# Patient Record
Sex: Female | Born: 1940 | Race: White | Hispanic: No | Marital: Married | Smoking: Former smoker
Health system: Southern US, Community
[De-identification: ages and names within clinical notes are randomized; demographics above are authoritative.]

## PROBLEM LIST (undated history)

## (undated) DIAGNOSIS — K219 Gastro-esophageal reflux disease without esophagitis: Secondary | ICD-10-CM

## (undated) DIAGNOSIS — N870 Mild cervical dysplasia: Secondary | ICD-10-CM

## (undated) DIAGNOSIS — I639 Cerebral infarction, unspecified: Secondary | ICD-10-CM

## (undated) DIAGNOSIS — F329 Major depressive disorder, single episode, unspecified: Secondary | ICD-10-CM

## (undated) DIAGNOSIS — Q211 Atrial septal defect: Secondary | ICD-10-CM

## (undated) DIAGNOSIS — Q2112 Patent foramen ovale: Secondary | ICD-10-CM

## (undated) DIAGNOSIS — D496 Neoplasm of unspecified behavior of brain: Secondary | ICD-10-CM

## (undated) DIAGNOSIS — F32A Depression, unspecified: Secondary | ICD-10-CM

## (undated) DIAGNOSIS — K5792 Diverticulitis of intestine, part unspecified, without perforation or abscess without bleeding: Secondary | ICD-10-CM

## (undated) DIAGNOSIS — N200 Calculus of kidney: Secondary | ICD-10-CM

## (undated) DIAGNOSIS — Z5189 Encounter for other specified aftercare: Secondary | ICD-10-CM

## (undated) DIAGNOSIS — M1990 Unspecified osteoarthritis, unspecified site: Secondary | ICD-10-CM

## (undated) DIAGNOSIS — M797 Fibromyalgia: Secondary | ICD-10-CM

## (undated) DIAGNOSIS — Z8774 Personal history of (corrected) congenital malformations of heart and circulatory system: Secondary | ICD-10-CM

## (undated) DIAGNOSIS — IMO0001 Reserved for inherently not codable concepts without codable children: Secondary | ICD-10-CM

## (undated) HISTORY — DX: Diverticulitis of intestine, part unspecified, without perforation or abscess without bleeding: K57.92

## (undated) HISTORY — DX: Calculus of kidney: N20.0

## (undated) HISTORY — PX: COLPOSCOPY: SHX161

## (undated) HISTORY — PX: BRAIN BIOPSY: SHX905

## (undated) HISTORY — PX: STOMACH SURGERY: SHX791

## (undated) HISTORY — DX: Cerebral infarction, unspecified: I63.9

## (undated) HISTORY — PX: KIDNEY STONE SURGERY: SHX686

## (undated) HISTORY — PX: BREAST ENHANCEMENT SURGERY: SHX7

## (undated) HISTORY — DX: Major depressive disorder, single episode, unspecified: F32.9

## (undated) HISTORY — PX: TUBAL LIGATION: SHX77

## (undated) HISTORY — DX: Personal history of (corrected) congenital malformations of heart and circulatory system: Z87.74

## (undated) HISTORY — PX: AUGMENTATION MAMMAPLASTY: SUR837

## (undated) HISTORY — DX: Mild cervical dysplasia: N87.0

## (undated) HISTORY — DX: Depression, unspecified: F32.A

---

## 1997-11-18 ENCOUNTER — Other Ambulatory Visit: Admission: RE | Admit: 1997-11-18 | Discharge: 1997-11-18 | Payer: Self-pay | Admitting: Obstetrics and Gynecology

## 1997-12-29 ENCOUNTER — Other Ambulatory Visit: Admission: RE | Admit: 1997-12-29 | Discharge: 1997-12-29 | Payer: Self-pay | Admitting: Obstetrics and Gynecology

## 1998-04-26 ENCOUNTER — Other Ambulatory Visit: Admission: RE | Admit: 1998-04-26 | Discharge: 1998-04-26 | Payer: Self-pay | Admitting: Obstetrics and Gynecology

## 1999-04-11 ENCOUNTER — Other Ambulatory Visit: Admission: RE | Admit: 1999-04-11 | Discharge: 1999-04-11 | Payer: Self-pay | Admitting: Obstetrics and Gynecology

## 1999-08-31 ENCOUNTER — Other Ambulatory Visit: Admission: RE | Admit: 1999-08-31 | Discharge: 1999-08-31 | Payer: Self-pay | Admitting: Obstetrics and Gynecology

## 1999-12-23 ENCOUNTER — Emergency Department (HOSPITAL_COMMUNITY): Admission: EM | Admit: 1999-12-23 | Discharge: 1999-12-24 | Payer: Self-pay | Admitting: Emergency Medicine

## 1999-12-24 ENCOUNTER — Encounter: Payer: Self-pay | Admitting: Emergency Medicine

## 1999-12-28 ENCOUNTER — Ambulatory Visit (HOSPITAL_COMMUNITY): Admission: RE | Admit: 1999-12-28 | Discharge: 1999-12-28 | Payer: Self-pay | Admitting: Urology

## 1999-12-28 ENCOUNTER — Encounter: Payer: Self-pay | Admitting: Urology

## 2000-04-10 ENCOUNTER — Other Ambulatory Visit: Admission: RE | Admit: 2000-04-10 | Discharge: 2000-04-10 | Payer: Self-pay | Admitting: Obstetrics and Gynecology

## 2001-02-12 ENCOUNTER — Ambulatory Visit (HOSPITAL_COMMUNITY): Admission: RE | Admit: 2001-02-12 | Discharge: 2001-02-12 | Payer: Self-pay | Admitting: Neurology

## 2001-02-12 ENCOUNTER — Encounter (INDEPENDENT_AMBULATORY_CARE_PROVIDER_SITE_OTHER): Payer: Self-pay | Admitting: *Deleted

## 2001-02-14 ENCOUNTER — Encounter: Payer: Self-pay | Admitting: Neurology

## 2001-02-14 ENCOUNTER — Ambulatory Visit (HOSPITAL_COMMUNITY): Admission: RE | Admit: 2001-02-14 | Discharge: 2001-02-14 | Payer: Self-pay | Admitting: Neurology

## 2001-03-04 ENCOUNTER — Encounter: Payer: Self-pay | Admitting: Neurology

## 2001-03-04 ENCOUNTER — Ambulatory Visit (HOSPITAL_COMMUNITY): Admission: RE | Admit: 2001-03-04 | Discharge: 2001-03-04 | Payer: Self-pay | Admitting: Neurology

## 2001-04-09 ENCOUNTER — Encounter: Payer: Self-pay | Admitting: Neurosurgery

## 2001-04-09 ENCOUNTER — Ambulatory Visit (HOSPITAL_COMMUNITY): Admission: RE | Admit: 2001-04-09 | Discharge: 2001-04-09 | Payer: Self-pay | Admitting: Neurosurgery

## 2001-04-14 ENCOUNTER — Encounter: Payer: Self-pay | Admitting: Neurosurgery

## 2001-04-16 ENCOUNTER — Encounter (INDEPENDENT_AMBULATORY_CARE_PROVIDER_SITE_OTHER): Payer: Self-pay | Admitting: Specialist

## 2001-04-16 ENCOUNTER — Inpatient Hospital Stay (HOSPITAL_COMMUNITY): Admission: RE | Admit: 2001-04-16 | Discharge: 2001-04-19 | Payer: Self-pay | Admitting: Neurosurgery

## 2001-04-16 ENCOUNTER — Encounter: Payer: Self-pay | Admitting: Neurosurgery

## 2001-04-17 ENCOUNTER — Encounter: Payer: Self-pay | Admitting: Neurosurgery

## 2001-06-17 ENCOUNTER — Other Ambulatory Visit: Admission: RE | Admit: 2001-06-17 | Discharge: 2001-06-17 | Payer: Self-pay | Admitting: Obstetrics and Gynecology

## 2002-03-24 ENCOUNTER — Encounter: Payer: Self-pay | Admitting: Internal Medicine

## 2002-03-24 ENCOUNTER — Inpatient Hospital Stay (HOSPITAL_COMMUNITY): Admission: EM | Admit: 2002-03-24 | Discharge: 2002-03-26 | Payer: Self-pay | Admitting: Emergency Medicine

## 2002-10-04 ENCOUNTER — Encounter: Payer: Self-pay | Admitting: Emergency Medicine

## 2002-10-04 ENCOUNTER — Inpatient Hospital Stay (HOSPITAL_COMMUNITY): Admission: EM | Admit: 2002-10-04 | Discharge: 2002-10-06 | Payer: Self-pay | Admitting: Emergency Medicine

## 2002-10-05 ENCOUNTER — Encounter (INDEPENDENT_AMBULATORY_CARE_PROVIDER_SITE_OTHER): Payer: Self-pay | Admitting: *Deleted

## 2002-10-05 ENCOUNTER — Encounter: Payer: Self-pay | Admitting: Pediatrics

## 2002-10-29 ENCOUNTER — Encounter: Payer: Self-pay | Admitting: Emergency Medicine

## 2002-10-29 ENCOUNTER — Emergency Department (HOSPITAL_COMMUNITY): Admission: EM | Admit: 2002-10-29 | Discharge: 2002-10-29 | Payer: Self-pay | Admitting: Emergency Medicine

## 2003-02-09 ENCOUNTER — Ambulatory Visit (HOSPITAL_COMMUNITY): Admission: RE | Admit: 2003-02-09 | Discharge: 2003-02-09 | Payer: Self-pay | Admitting: Neurology

## 2003-12-14 ENCOUNTER — Ambulatory Visit (HOSPITAL_COMMUNITY): Admission: RE | Admit: 2003-12-14 | Discharge: 2003-12-14 | Payer: Self-pay | Admitting: Neurology

## 2004-10-31 ENCOUNTER — Other Ambulatory Visit: Admission: RE | Admit: 2004-10-31 | Discharge: 2004-10-31 | Payer: Self-pay | Admitting: Obstetrics and Gynecology

## 2006-11-14 ENCOUNTER — Encounter: Admission: RE | Admit: 2006-11-14 | Discharge: 2006-11-14 | Payer: Self-pay | Admitting: Family Medicine

## 2006-12-09 ENCOUNTER — Other Ambulatory Visit: Admission: RE | Admit: 2006-12-09 | Discharge: 2006-12-09 | Payer: Self-pay | Admitting: Obstetrics and Gynecology

## 2007-02-10 ENCOUNTER — Encounter: Admission: RE | Admit: 2007-02-10 | Discharge: 2007-02-10 | Payer: Self-pay | Admitting: Gastroenterology

## 2008-04-15 ENCOUNTER — Observation Stay (HOSPITAL_COMMUNITY): Admission: EM | Admit: 2008-04-15 | Discharge: 2008-04-16 | Payer: Self-pay | Admitting: Emergency Medicine

## 2008-06-02 ENCOUNTER — Inpatient Hospital Stay (HOSPITAL_COMMUNITY): Admission: EM | Admit: 2008-06-02 | Discharge: 2008-06-05 | Payer: Self-pay | Admitting: Emergency Medicine

## 2008-06-03 ENCOUNTER — Ambulatory Visit: Payer: Self-pay | Admitting: Vascular Surgery

## 2008-06-03 ENCOUNTER — Encounter (INDEPENDENT_AMBULATORY_CARE_PROVIDER_SITE_OTHER): Payer: Self-pay | Admitting: Internal Medicine

## 2008-06-28 ENCOUNTER — Encounter: Admission: RE | Admit: 2008-06-28 | Discharge: 2008-07-27 | Payer: Self-pay | Admitting: Psychology

## 2008-07-28 ENCOUNTER — Ambulatory Visit: Payer: Self-pay | Admitting: Psychology

## 2008-12-09 ENCOUNTER — Ambulatory Visit: Payer: Self-pay | Admitting: Obstetrics and Gynecology

## 2008-12-09 ENCOUNTER — Encounter: Payer: Self-pay | Admitting: Obstetrics and Gynecology

## 2008-12-09 ENCOUNTER — Other Ambulatory Visit: Admission: RE | Admit: 2008-12-09 | Discharge: 2008-12-09 | Payer: Self-pay | Admitting: Obstetrics and Gynecology

## 2008-12-22 ENCOUNTER — Ambulatory Visit: Payer: Self-pay | Admitting: Obstetrics and Gynecology

## 2009-03-07 ENCOUNTER — Encounter (INDEPENDENT_AMBULATORY_CARE_PROVIDER_SITE_OTHER): Payer: Self-pay | Admitting: Emergency Medicine

## 2009-03-07 ENCOUNTER — Ambulatory Visit: Payer: Self-pay | Admitting: Vascular Surgery

## 2009-03-07 ENCOUNTER — Emergency Department (HOSPITAL_COMMUNITY): Admission: EM | Admit: 2009-03-07 | Discharge: 2009-03-07 | Payer: Self-pay | Admitting: Emergency Medicine

## 2009-12-12 ENCOUNTER — Ambulatory Visit: Payer: Self-pay | Admitting: Obstetrics and Gynecology

## 2009-12-12 ENCOUNTER — Other Ambulatory Visit: Admission: RE | Admit: 2009-12-12 | Discharge: 2009-12-12 | Payer: Self-pay | Admitting: Obstetrics and Gynecology

## 2010-07-06 LAB — CBC
HCT: 35 % — ABNORMAL LOW (ref 36.0–46.0)
HCT: 35.3 % — ABNORMAL LOW (ref 36.0–46.0)
Hemoglobin: 11.8 g/dL — ABNORMAL LOW (ref 12.0–15.0)
Hemoglobin: 11.9 g/dL — ABNORMAL LOW (ref 12.0–15.0)
MCHC: 33.7 g/dL (ref 30.0–36.0)
MCV: 94.4 fL (ref 78.0–100.0)
RBC: 3.7 MIL/uL — ABNORMAL LOW (ref 3.87–5.11)
RBC: 3.74 MIL/uL — ABNORMAL LOW (ref 3.87–5.11)
RDW: 13.9 % (ref 11.5–15.5)
RDW: 14.1 % (ref 11.5–15.5)
WBC: 6.6 10*3/uL (ref 4.0–10.5)

## 2010-07-06 LAB — COMPREHENSIVE METABOLIC PANEL
ALT: 15 U/L (ref 0–35)
Alkaline Phosphatase: 45 U/L (ref 39–117)
Alkaline Phosphatase: 51 U/L (ref 39–117)
BUN: 15 mg/dL (ref 6–23)
BUN: 23 mg/dL (ref 6–23)
CO2: 31 mEq/L (ref 19–32)
CO2: 32 mEq/L (ref 19–32)
Creatinine, Ser: 1.08 mg/dL (ref 0.4–1.2)
GFR calc non Af Amer: 50 mL/min — ABNORMAL LOW (ref >60)
GFR calc non Af Amer: >60 mL/min (ref >60)
Glucose, Bld: 84 mg/dL (ref 70–99)
Glucose, Bld: 99 mg/dL (ref 70–99)
Potassium: 3.5 mEq/L (ref 3.5–5.1)
Potassium: 3.7 mEq/L (ref 3.5–5.1)
Sodium: 142 mEq/L (ref 135–145)
Total Protein: 6 g/dL (ref 6.0–8.3)

## 2010-07-06 LAB — CK TOTAL AND CKMB (NOT AT ARMC)
CK, MB: 1.4 ng/mL (ref 0.3–4.0)
Total CK: 258 U/L — ABNORMAL HIGH (ref 7–177)

## 2010-07-06 LAB — DIFFERENTIAL
Eosinophils Absolute: 0.2 10*3/uL (ref 0.0–0.7)
Eosinophils Relative: 4 % (ref 0–5)
Monocytes Relative: 7 % (ref 3–12)

## 2010-07-06 LAB — IRON AND TIBC
Iron: 49 ug/dL (ref 42–135)
TIBC: 259 ug/dL (ref 250–470)

## 2010-07-06 LAB — TSH
TSH: 3.317 u[IU]/mL (ref 0.350–4.500)
TSH: 3.459 u[IU]/mL (ref 0.350–4.500)

## 2010-07-06 LAB — FERRITIN: Ferritin: 39 ng/mL (ref 10–291)

## 2010-07-06 LAB — RETICULOCYTES
RBC.: 3.79 MIL/uL — ABNORMAL LOW (ref 3.87–5.11)
Retic Count, Absolute: 22.7 10*3/uL (ref 19.0–186.0)
Retic Ct Pct: 0.6 % (ref 0.4–3.1)

## 2010-07-06 LAB — URINALYSIS, ROUTINE W REFLEX MICROSCOPIC
Glucose, UA: NEGATIVE mg/dL
Hgb urine dipstick: NEGATIVE
Nitrite: NEGATIVE
Specific Gravity, Urine: 1.03 (ref 1.005–1.030)
Urobilinogen, UA: 1 mg/dL (ref 0.0–1.0)

## 2010-07-06 LAB — RPR: RPR Ser Ql: NONREACTIVE

## 2010-07-06 LAB — LIPID PANEL
Cholesterol: 133 mg/dL (ref 0–200)
HDL: 43 mg/dL (ref >39)
LDL Cholesterol: 78 mg/dL (ref 0–99)
Triglycerides: 61 mg/dL (ref <150)

## 2010-07-06 LAB — D-DIMER, QUANTITATIVE: D-Dimer, Quant: 2.2 ug/mL-FEU — ABNORMAL HIGH (ref 0.00–0.48)

## 2010-07-06 LAB — CARDIAC PANEL(CRET KIN+CKTOT+MB+TROPI)
CK, MB: 1.4 ng/mL (ref 0.3–4.0)
Relative Index: 0.6 (ref 0.0–2.5)

## 2010-07-06 LAB — POCT CARDIAC MARKERS: Myoglobin, poc: 119 ng/mL (ref 12–200)

## 2010-07-06 LAB — HOMOCYSTEINE: Homocysteine: 8.9 umol/L (ref 4.0–15.4)

## 2010-07-06 LAB — URINE MICROSCOPIC-ADD ON

## 2010-07-06 LAB — BRAIN NATRIURETIC PEPTIDE: Pro B Natriuretic peptide (BNP): 105 pg/mL — ABNORMAL HIGH (ref 0.0–100.0)

## 2010-07-06 LAB — HEMOGLOBIN A1C: Mean Plasma Glucose: 105 mg/dL

## 2010-07-06 LAB — PROTIME-INR: Prothrombin Time: 14 seconds (ref 11.6–15.2)

## 2010-07-06 LAB — VITAMIN B12: Vitamin B-12: 725 pg/mL (ref 211–911)

## 2010-07-10 LAB — LIPID PANEL
Cholesterol: 135 mg/dL (ref 0–200)
LDL Cholesterol: 84 mg/dL (ref 0–99)

## 2010-07-10 LAB — COMPREHENSIVE METABOLIC PANEL
ALT: 17 U/L (ref 0–35)
AST: 31 U/L (ref 0–37)
CO2: 27 mEq/L (ref 19–32)
Calcium: 9.4 mg/dL (ref 8.4–10.5)
Chloride: 101 mEq/L (ref 96–112)
Creatinine, Ser: 0.97 mg/dL (ref 0.4–1.2)
GFR calc Af Amer: >60 mL/min (ref >60)
GFR calc non Af Amer: 57 mL/min — ABNORMAL LOW (ref >60)
Glucose, Bld: 95 mg/dL (ref 70–99)
Total Bilirubin: 0.7 mg/dL (ref 0.3–1.2)

## 2010-07-10 LAB — CBC
Hemoglobin: 12.4 g/dL (ref 12.0–15.0)
MCHC: 32.8 g/dL (ref 30.0–36.0)
MCV: 93.7 fL (ref 78.0–100.0)
RBC: 4.05 MIL/uL (ref 3.87–5.11)
WBC: 7.6 10*3/uL (ref 4.0–10.5)

## 2010-07-10 LAB — DIFFERENTIAL
Basophils Absolute: 0 10*3/uL (ref 0.0–0.1)
Eosinophils Absolute: 0 10*3/uL (ref 0.0–0.7)
Eosinophils Relative: 1 % (ref 0–5)
Lymphocytes Relative: 28 % (ref 12–46)
Neutrophils Relative %: 62 % (ref 43–77)

## 2010-08-08 NOTE — H&P (Signed)
[name], BRASHEAR                ACCOUNT NO.:  [id number]   MEDICAL RECORD NO.:  [id number]          PATIENT TYPE:  EMS   LOCATION:  [facility]                         FACILITY:  [facility]   PHYSICIAN:  Hollice Espy, M.D.DATE OF BIRTH:  1940/10/15   DATE OF ADMISSION:  04/15/2008  DATE OF DISCHARGE:                              HISTORY & PHYSICAL   The patient's PCP is Dr. Marny Lowenstein.   CHIEF COMPLAINT:  Blurry vision and headache.   HISTORY OF PRESENT ILLNESS:  The patient is a 70 year old white female  with past medical history of previous CVA with no residual deficits,  hypertension who for the last several months family has been noting some  increased memory issues.  However, starting yesterday she started having  some severe blurry vision and headache, worst at about 11:00 last night.  However, symptoms persisted into today. Family became worried, and they  brought the patient into the emergency room for further evaluation.  In  the emergency room she had labs done which were essentially  unremarkable.  A CT scan of the head showed some encephalomalacia and  old infarct but no evidence of any acute CVA.  The patient also was  complaining of some difficulty swallowing, although she seemed to have  passed an ER swallow evaluation without problem.  She in addition also  complained of not eating very much. She says she had an appetite, but  after a few bites she just does not feel like eating any more. She  otherwise is doing well.  She says the headache is better now.  She does  complain of some mild blurry vision but not as severe.  No dysphagia  currently. No chest pain, palpitations, shortness breath, wheeze, cough,  abdominal pain, hematuria, dysuria, constipation, diarrhea, focal  extremity numbness weakness or pain.   REVIEW OF SYSTEMS:  Otherwise negative.   PAST MEDICAL HISTORY:  Includes CVA, hypertension, anxiety, sleep  disturbance and depression.   MEDICATIONS:   She is on HCTZ 25, Ambien 10 q.h.s., Wellbutrin 300,  Klonopin 1, Protonix 40. She also has a history of GERD.   ALLERGIES:  She has no known drug allergies.   SOCIAL HISTORY:  She denies any tobacco, alcohol or drug use.   FAMILY HISTORY:  Noncontributory.   FAMILY HISTORY:  Noted for diabetes mellitus.   PHYSICAL EXAMINATION:  VITALS:  On admission temperature 97.2, heart  rate 83, blood pressure 137/76, respirations 16, O2 sat 98% on room air.  On her own blood pressure came down to 114/73.  GENERAL:  She is alert and oriented x3, in no apparent distress.  HEENT:  Normocephalic, atraumatic.  Mucous membranes are slightly dry.  She has no carotid bruits.  Cranial nerves II through XII are intact.  HEART:  Regular rate and rhythm, S1-S2, a 2/6 systolic ejection murmur.  LUNGS: Clear to auscultation bilaterally.  ABDOMEN:  Soft, nontender, nondistended.  Positive bowel sounds.  EXTREMITIES: No clubbing, cyanosis or edema.  NEUROLOGICAL:  She has no focal neurological deficits.  No loss of  sensation. Grip extension and flexion are all  5/5 symmetric on both  sides.  Finger-to-nose is normal.  Downgoing toes.   LAB WORK:  White count 7.6, H and H 12.4 and 37.9, MCV of 93.7, platelet  count 188 with 16% neutrophils.  Sodium 130, potassium 3.2, chloride  101, bicarb 27, BUN 19, creatinine 0.97, glucose 95.  LFTs were  completely unremarkable including albumin 3.6.  CT scan of the head  showed no evidence of any acute intracranial abnormality, right frontal  encephalomalacia, remote right cerebellar infarct.   ASSESSMENT AND PLAN:  1. Blurry vision, headache.  2. History of cerebrovascular accident.  3. Hypertension.  This possibly could be a transient ischemic attack,      or this could be an old cerebrovascular accident with worsening      encephalomalacia that leads to deterioration.  She also could have      some early signs of Alzheimer's disease.  Will plan to check an       MRI/ MRA as well as continue checking the screening labs, fasting      lipid profile and homocystine.  Will continue all of her home meds.      If she passes all this, then would recommend outpatient monitoring      of the patient. And I have already discussed with family. They tell      me that they already have plans for the patient, who lives alone      right now, to move in with a son-in-law.  4. Hypertension.  Continue medications.  5. Hypokalemia. Will replace.  6. Sleep disturbance.  7. Gastroesophageal reflux disease.  Will continue medications.      Hollice Espy, M.D.  Electronically Signed     SKK/MEDQ  D:  04/15/2008  T:  04/15/2008  Job:  [id number]   cc:   Jethro Bastos, M.D.

## 2010-08-08 NOTE — H&P (Signed)
[name], [name]                ACCOUNT NO.:  [id number]   MEDICAL RECORD NO.:  [id number]          PATIENT TYPE:  INP   LOCATION:  3707                         FACILITY:  [facility]   PHYSICIAN:  Michiel Cowboy, MDDATE OF BIRTH:  May 07, 1940   DATE OF ADMISSION:  06/02/2008  DATE OF DISCHARGE:                              HISTORY & PHYSICAL   CHIEF COMPLAINTS:  Collapse.   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   The patient was walking her dog today when she noticed that her knees  buckled and she fell to the ground.  She managed to protect her head  from hitting the asphalt.  Did not have any severe injury.  She remained  conscious throughout this whole episode.  Then she had another episode  when she was checking the mail the same day walking up the stairs; also  sensation of her knees buckling and her going down.  The patient also  had been endorsing shuffling gait, loss of balance, occasional ataxia,  writing getting smaller and memory problems.  This has been going on for  quite some time  per her family, maybe even months.  She also has been  having some low grade fevers, nothing above 100.  The patient has been  having some left-sided and right-sided chest pain that is reproducible  by palpation and thought to be secondary to the chest wall tenderness.  Occasional shortness of breath but nothing recently.  Otherwise she also  noticed that she is urinating more than she usually does.  Otherwise  review of systems unremarkable.   PAST MEDICAL HISTORY:  1. 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  2. Stroke.  3. Peptic ulcer disease.  4. Kidney stones in the past.  5. Senile dementia.   SOCIAL HISTORY:  The patient used to smoke in the very remote past,  quit  in 1970s.  Does not drink alcohol.  Does not use drugs.  Lives at home.  Supportive family.   FAMILY HISTORY:  Consider consistent with strokes and diabetes.   ALLERGIES:  No known drug allergies.   MEDICATIONS:  1. Citalopram 20 mg daily.  2. Clonazepam 1.5 mg at bedtime.  3. Protonix 40 daily.  4. Hydrochlorothiazide 25 daily.  5. Aspirin 325 daily.  6. Wellbutrin 300 daily.  7. Calcium.  8. Aspirin 325 daily.   PHYSICAL EXAMINATION:  VITAL SIGNS:  Temperature 99.3, blood pressure  122/71, pulse 73, respirations 20,oxygen saturation 96% room air.  GENERAL:  The patient appears to be in no acute distress.  HEENT:  Nontraumatic. Somewhat dryish mucous membranes and decreased  skin turgor.  LUNGS:  Clear to auscultation, but somewhat distant.  HEART:  Regular rate and rhythm.  No murmurs, rubs or gallops.  ABDOMEN:  Soft, nontender, nondistended.  LOWER EXTREMITIES:  Without clubbing, cyanosis or edema.  There is very  mild rigidity noted.  NEUROLOGIC:  Otherwise cranial nerves II-XII intact.  Strength 5/5 in  all four extremities.  Finger-to-nose somewhat worse on the left than  right.  SKIN: Clean, dry and intact.   LABORATORY DATA:  White blood cell count 6.6, hemoglobin 11.9, sodium  140, potassium 3.5, creatinine 1.08.  LFTs within normal limits.  Cardiac enzymes within normal limits.  Urinalysis showing an  leukocytosis and few bacteria.  Urine culture not obtained.   A CT scan of the head showing no acute events.  Chest x-ray showing  questionable COPD but no ascites.  EKG is sinus rhythm, rate of 63 with  T-wave flattening in leads V1 and V2, which was noted also on old EKG  and lead V3, somewhat new, could be secondary to lead positioning.   ASSESSMENT AND PLAN:  This is a 70 year old female with history of  cerebellar stroke in the past with symptoms worrisome for early onset  Parkinson's disease and urinary tract infection. She presents with  collapse.   Denies loss of consciousness.  1. Collapse.  Not quite sure; the patient denies completely that this      was a mechanical fall, but feel that it is more of a transient loss      of strength.  Cannot quite explain it well, but will treat as      potential transient ischemic attack.  Will repeat MRI/MRA.  The      patient has not had an echocardiogram since 2004.  Will do that and      will check carotid Dopplers.  Check homocysteine level, fasting      lipid panel, hemoglobin A1c, TSH, B12, folate, especially given      memory problems.  Given the signs that would be worrisome for early      Parkinson, consider neurology consult as family is very interested      in finding out the cause for her problems during this      hospitalization.  2. Urinary tract infection.  Will obtain urine culture and cover for      now with Rocephin.  This could explain some of her symptoms.  3. Slightly low potassium.  Will replace.  4. History of peptic ulcer disease.  Continue Protonix.  5. Depression,  Continue Citalopram and Wellbutrin.  Will decrease      slightly dose of clonazepam to one at bedtime because perhaps some      of her symptoms could be explained by clonazepam being a little too      high.  6. Slight shortness of breath and collapse.  Severely doubt that this      is cardiac in etiology but given risk factors will cycle cardiac      enzymes.  7. Prophylaxis.  Protonix and SCDs.  8. Code status.  The patient wished to be DNR/DNI which was discussed      with her family.  Everybody is in agreement.      Michiel Cowboy, MD  Electronically Signed     AVD/MEDQ  D:  06/02/2008  T:  06/03/2008  Job:  [id number]   cc:   Dr. Modena Jansky

## 2010-08-08 NOTE — Discharge Summary (Signed)
[name], [name]                ACCOUNT NO.:  [id number]   MEDICAL RECORD NO.:  [id number]          PATIENT TYPE:  INP   LOCATION:  3707                         FACILITY:  [facility]   PHYSICIAN:  Corinna L. Lendell Caprice, MDDATE OF BIRTH:  07/07/1940   DATE OF ADMISSION:  06/02/2008  DATE OF DISCHARGE:  06/05/2008                               DISCHARGE SUMMARY   DISCHARGE DIAGNOSES:  1. Weakness, falls, cognitive decline, dizziness, possibly early      Parkinson's, workup to continue as an outpatient.  2. Urinary tract infection.  3. History of stroke.  4. Previous history of brain biopsy showing necrotic tissue.  5. History of peptic ulcer disease.  6. History of depression.  7. Tremor.  8. History of hypertension, blood pressure normal off      hydrochlorothiazide.   DISCHARGE MEDICATIONS:  1. Stop Wellbutrin.  2. Change citalopram to every other day for a week then stop.  3. Stop hydrochlorothiazide.  4. Continue Protonix 40 mg a day.  5. Aspirin 325 mg a day.  6. Decrease clonazepam to 0.5 mg nightly.  7. Ceftin 250 mg p.o. b.i.d. until gone.  8. Namenda 5 mg daily for 6 days, then b.i.d. for 7 days, then 10 mg      twice a day.   ACTIVITY:  No driving until cleared by Dr. Anne Hahn, and no return to work  until cleared by Dr. Anne Hahn.  Follow up with Dr. Anne Hahn, please call for  appointment time, which apparently has been changed by Dr. Vickey Huger.  Dr. Anne Hahn to arrange neuropsychiatric testing.  Condition stable.  Home  physical therapy and safety evaluation has been arranged for balance.   CONSULTATIONS:  Melvyn Novas, MD   PROCEDURES:  None.   LABORATORY DATA:  CBC significant for a hemoglobin of 11.9, hematocrit  35.3, otherwise unremarkable.  D-dimer 2.2.  PT/PTT normal.  Reticulocyte count normal.  Complete metabolic panel unremarkable.  CPK  258, otherwise, normal cardiac enzymes.  BNP 105.  Iron 49, ferritin 39,  TIBC 259, vitamin B12 725, folate greater than 20.   Homocystine 8.9.  Urinalysis was cloudy showed a specific gravity of 1.030, small  bilirubin, 15 ketones, negative nitrite, moderate leukocyte esterase, a  few squamous epithelial cells, 21-50 white cells, 3-6 red cells, a few  bacteria.  Urine culture 65,000 colonies of multiple bacterial  morphotypes.  RPR nonreactive.   SPECIAL STUDIES/RADIOLOGY:  1. EKG showed normal sinus rhythm, probable left atrial abnormality,      nonspecific changes.  2. Two views of the chest show probable COPD, chronic bronchitis,      nothing acute.  3. CT brain showed postop right frontal craniotomy with      encephalomalacia of the right frontal lobe, unchanged from      previous, chronic right PICA infarct unchanged, nothing acute.  4. MRI of the brain without contrast showed nothing acute, old      cerebellar strokes, worse right than left, chronic post resected      changes in the posterior frontal region on the right with a pattern  of underlying gliosis, slightly more prominent when compared to      2005 but has not changed since January 2010, suggest follow up in 6      months.  5. MRA of the brain was negative.  6. CT angiogram of the chest showed no PE and possible pulmonary      hypertension.  7. Echocardiogram showed normal ejection fraction, increased relative      contribution of atrial contraction to left ventricular filling,      mild aortic valvular regurgitation, right atrium mildly dilated.  8. Doppler of the carotids showed minimal focal heterogeneous plaque,      no significant stenosis.  Vertebral artery flow antegrade      bilaterally.   HISTORY AND HOSPITAL COURSE:  [name] is a 70 year old white female,  patient of Dr. Modena Jansky, with a complicated progression of cognitive  decline, gait instability, falls, weakness, dizziness, and tremor.  Apparently, she has been seen in the office about this.  She was also  admitted and worked up in January.  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  She drives, but her family has noticed that she has had severe  worsening of her cognitive function.  They also thought that her gait  seemed shuffling, she was off balance.  Her appetite has been poor.  She  has fallen several times in the past week.  She had low-grade fevers.  She had no dysuria.  She is on hydrochlorothiazide.  She has a history  of depression.  She has been on Wellbutrin for a bit over a year.  Her  symptoms started about a year ago.  She also was started on citalopram  several weeks ago.  When questioned further, she reported that she has  auditory hallucinations at home sometimes.  There have been episodes  where she has been babysitting and left, forgetting that the kids were  still at home.  She seems to say the same things over and over in  conversation and asks the same questions.  She does have orthostatic  dizziness but also feels that occasionally, the room seems to spin.  She  has had no improvement in her depression on Wellbutrin or citalopram.  She had a temperature of 99.3 on admission, blood pressure 122/71,  otherwise, unremarkable vital signs.  She had dry mucous membranes and  somewhat worse finger-to-nose testing on the left.  She performed well  on mini-mental status.  She scored 30 out of 30.  The patient was found  to have a urinary tract infection and started on Rocephin.  She was felt  also to be somewhat volume depleted, and her hydrochlorothiazide was  stopped.  She was also given IV fluids.  Orthostatics were not done on  admission but when done the following day after admission, she had no  orthostasis.  Her blood pressure has ranged from 100 to 130 off her  hydrochlorothiazide.  Her antibiotics have been changed to p.o.  She had  an exhaustive workup for her various symptoms.  Other than the UTI, not  much was found.  The timing of her tremors and some of her other  symptoms seemed to coincide with the Wellbutrin.  I  decreased this to  150 and will stop this to see if any of her symptoms resolve.  Also, I  feel that the citalopram is making the clinical picture more difficult.  Some of her symptoms may be due to her medications.  She has only been  on it for a few weeks and I have tapered it, and we will taper this off  for now.  She reports no improvement in her mood on either of these  medications.  She may need to be started back on antidepressants but  would hold off for a while until this is further clarified.  Also, her  clonazepam has been decreased.  I would not resume her  hydrochlorothiazide because it may be contributing to her falls and  orthostatic dizziness.  When I examined her, I noted a tremor, which  appeared to the intention to me, it was not pill-rolling.  She has had a  normal affect and no stone facies.  Her gait did not appear shuffling to  my exam.  She also did not have cogwheel rigidity.  Dr. Vickey Huger,  however, noted that she seemed to have some mild shuffling of her gait  and possibly some spasticity and early cogwheeling.  She is concerned  that this may be early dementia, possibly early Parkinson's, possibly  medication related, possibly depression related.  She agreed with the  change in her medications, and she also recommended a trial of Namenda.  She also recommended neuropsychiatric testing, which could not be done  during this hospitalization and will need to be set up by Dr. Anne Hahn.  The patient had been scheduled to follow up with Dr. Anne Hahn in April,  but apparently, Dr. Vickey Huger has moved this appointment.  Unfortunately,  I do not know the date or time, and I have asked the family members to  clarify this.  I have also arranged home PT and a safety evaluation.  The patient received physical therapy while here  and balance training.  The patient feels somewhat better but still has a  lot of the same chronic symptoms and is at this point stable for  discharge and  has received maximal hospital benefit.  I have talked  extensively every day with the daughter including today and total time  on the day of discharge is 60 minutes.      Corinna L. Lendell Caprice, MD  Electronically Signed     CLS/MEDQ  D:  06/05/2008  T:  06/05/2008  Job:  [id number]   cc:   Marlan Palau, M.D.  Jethro Bastos, M.D.  Gladstone Pih, Ph.D.

## 2010-08-08 NOTE — Discharge Summary (Signed)
[name], [name]                ACCOUNT NO.:  [id number]   MEDICAL RECORD NO.:  [id number]          PATIENT TYPE:  OBV   LOCATION:  2029                         FACILITY:  [facility]   PHYSICIAN:  Hollice Espy, M.D.DATE OF BIRTH:  12/10/1940   DATE OF ADMISSION:  04/15/2008  DATE OF DISCHARGE:  04/16/2008                               DISCHARGE SUMMARY   PRIMARY CARE PHYSICIAN:  Marissa Bastos, MD   DISCHARGE DIAGNOSES:  1. Dizziness.  2. Muscle weakness.  3. Senile depression.  4. Hypertension not otherwise specified.  5. Gastroesophageal reflux disease.   DISCHARGE MEDICATIONS:  The patient will resume all the previous  medications:  1. Wellbutrin 300 p.o. daily.  2. Protonix 20 p.o. daily.  3. Hydrochlorothiazide 20 p.o. daily.  4. Aspirin 325 p.o. daily.  5. Clonazepam 0.5 p.o. nightly.  6. Ambien 10 p.o. nightly.  7. Tylenol PM 1 tablet p.o. nightly.   Overall, disposition from initial presentation is mild improvement.  Activity as tolerated.  The patient will be discharged in the care of  her family.  She does live at home by herself with family close by.  Eventually, this will be changed.  The patient will move in with family  member.  Activity will be slowly increased.  Discharge diet will be a  heart-healthy diet.   HOSPITAL COURSE:  The patient is a 70 year old white female with past  medical history of CVA and hypertension, who presented to the emergency  room after family had stated that she was feeling weak, had had some  what they were referred to as dizziness spells, questionable blurry  vision episodes, which started the night prior.  However, in an  extensive discussion with the family and the patient, she actually seems  to be having some mild decline over the past couple of months,  specifically in terms of not eating very much where they describe it as  she is hungry, but after a few bites, she stops eating.  In addition,  she also describes  difficult time sleeping, requiring Ambien, Klonopin,  and Tylenol PM and seems to be a bit more forgetful at times.  They were  concerned about her safety and during her previous history of stroke,  they were concerned about the possibility of a new stroke.  A CT scan of  the head was negative in the emergency room and while she was still  having some complaints of some of her symptoms, when I examined her, she  had a completely normal neurological exam with no focal deficits.  From  previous CVA, she has had no previous episodes.  Her CT scan of the head  showed no evidence of any acute CVA and did show some mild  encephalomalacia as well.  The patient was observed overnight.  Her lab  work was done.  Her fasting lipid profile showed an HDL of 37, but  otherwise looked to be a relatively normal lipid profile.  Her  homocysteine level was normal as well.  Her blood pressures throughout  the day were in the stable range running  anywhere from 110s to 120s.  She had no evidence by telemetry.  As of the evening of April 16, 2008, the patient was still awaiting for an MRI.  We sat and had an  extensive discussion with the family.   In regards to her nutrition was actually she appeared to be taking  adequate p.o. intake as her albumin level was 3.6.  We also then  discussed her depression and her daughter was concerned that perhaps her  depression was not fully treated.  She had been on Wellbutrin 300, now  for over a year and in discussion with the patient herself, she had a  slightly flattened affect.  She was interactive, showed some animated  spirits, but still looked to be mildly depressed.  The patient herself  said that she had been under a lot of stress, had a recent divorce,  living arrangements, and now the talk of having to move in with other  family because she was not safe to be home was a bit overwhelming for  her.  She did not appear to be in a severe major depressive  disorder.  Certainly, was not suicidal, but did look to be inadequately treated for  her depression issues.  Given the patient's normal blood pressures,  nonfocal neurological findings, negative head CT, stable lipid profile  and homocysteine levels, we discussed and agreed that the patient had a  less likely chance of having a CVA.  Because she has had a previous CVA,  we will go ahead and get the MRI, which was planned to be done here in  several hours immediately upon completion to discharge the patient home  versus waiting until the following day for a report and which was  negative.  We discharged the patient home without any further  intervention.  We will plan to discharge the patient home immediately  after MRI is performed an will call her tomorrow with the results.   In regards to her depression, we will defer this to her PCP, but we will  make plans for the patient to see her PCP next week and at that time, he  can make some adjustments in her antidepressant medications as well as  refer her to perhaps a therapeutic who can deal with some severe issues.   In regards to her hypertension, this was a stable medical issues.  She  will continue on her medicines.   In regards to her sleep disturbance, she needed prescriptions for the  next few days for Klonopin and Ambien.  We will give her for 10 days'  worth and she will receive the rest with Dr. Dorothe Pea.  The patient, note  that upon admission to the hospital, had a complete normal CBC and CMET  indicating no electrolyte abnormalities or signs of infection.   The patient's overall disposition from initial presentation is improved  and she is being discharged to home.  I had a discussion with family and  while the patient's current situation is that she lives alone, they are  planning to have her move in with one of the daughters in the next few  months.      Hollice Espy, M.D.  Electronically Signed     SKK/MEDQ   D:  04/16/2008  T:  04/17/2008  Job:  [id number]   cc:   [name], M.D.

## 2010-08-08 NOTE — Consult Note (Signed)
NAMESTEFANIE, [name]                ACCOUNT NO.:  [id number]   MEDICAL RECORD NO.:  [id number]          PATIENT TYPE:  INP   LOCATION:  3707                         FACILITY:  [facility]   PHYSICIAN:  Melvyn Novas, M.D.  DATE OF BIRTH:  09-25-1940   DATE OF CONSULTATION:  06/04/2008  DATE OF DISCHARGE:                                 CONSULTATION   This patient was admitted by Dr. Crista Curb, to the [facility]  Team.  The patient has been followed by Dr. Lesia Sago, in outpatient  setting at [facility].   The patient is presenting with gait difficulties, sudden falls,  shuffling gait, an increase in tremor activity, and notable cognitive  decline according to family and friends of the patient.   HISTORY OF PRESENT ILLNESS:  This patient who presented already in  October 2009 with similar problems to Dr. Lesia Sago and had an MRA  and MRI of the brain obtained on April 18, 2008, read as normal with  no acute changes; however, the patient has 3 areas of encephalomalacia  or old stroke activity identified.  The MRI's did not show any reason  for the patient to have worsening of her gait disorder.  Findings were that of a right posterior frontal encephalomalacia and  gliosis representing a previous brain biopsy site.  The area is  unchanged from previous MRIs again.  There is no suggestion of an active  process or increased in volume.  She has a stable old right paramedian cerebellar infarct and right  frontal surgery, but no parenchymal haemorrhage.  Carotid and vertebral musculature were widely patent.  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   The patient was now admitted after having again cognitive deficits, and  the daughter is concerned about some of her symptoms may be related to  depression versus an auditory processing disturbance that could lead to  cognitive function decline.  According to the patient,  she is working as  a Customer service manager, is separated after 20 years of marriage; actually,  now her divorce was recently finalized.  She needs to drive to make a  living.  She lives alone and she has recently developed auditory hallucinations,  hearing dog's voices from the wall, presenting with no visual component  to these hallucinations.   PAST MEDICAL HISTORY:  Positive for:  1. Hypertension.  2. Cerebellar stroke.  3. Depression - anxiety.  4. Possible essential tremor.  5. Eczema.  6. Tension headache.  7. Seasonal allergies.  8. Gastric ulcer disease.  9. Insomnia.  10.Recently, dizziness, which was not specified as vertigo.   The patient states that she feels as if the room is scoot when she tries  to walk through it and she hold onto the wall as not to fall.  She also  has developed a more shuffling gait.  She denies any urinary  incontinence.  The patient was recently started on citalopram 20 mg  daily, has been on Wellbutrin for almost a year.  She has been on  clonazepam, Protonix, hydrochlorothiazide, and aspirin.   FAMILY MEDICAL HISTORY:  Left empty.   SOCIAL HISTORY:  Again, the patient was married and her second marriage,  has 2 adult children from her first marriage.   REVIEW OF SYSTEMS:  Fever, weakness, shortness of breath, angina,  shuffling gait, memory decline, anxiety, and auditory hallucinations.   PHYSICAL EXAMINATION:  VITAL SIGNS:  Blood pressure 107/66, with pulse  rate of 64 that is regular, respiratory rate of 16-18 without any  activity, temperature was 97 degrees Fahrenheit.  MENTAL STATUS:  The patient is alert and oriented.  On a mini-mental  test, she scored 30/30 points.  She is fluent in her speech.  Observed,  has a nystagmus to the left when her cranial nerves are examined, but  she has equal facial structures.  She does have a mild mandibular  tremor.  She has normal speech.  No slurring.  No aphasia.  Tongue is in  midline and  symmetrical without fasciculations.  Bilateral equal  shoulder shrug.  Coordination, both finger-nose-finger test is intact,  but exacerbates a tremor.  Fine motor movements are also exacerbated  show also exacerbation of the tremor.  Gait is indeed shuffling and the  patient has a smaller width to her steps.  She held tightly on to my  hand as she walked in a room for less than 12 steps.  Deep tendon  reflexes were symmetric with downgoing toes.  No drift as to pronation  was seen.  Sensation is intact. to primary modalities.   I reviewed the labs and the patient's imaging studies.  The patient had  a recent echo with Doppler study, which shows no ICA stenosis or 2D  echo, which shows left ventricular normal function with a 60% EF;  possibility of a bicuspid aortic valve was discussed.   ASSESSMENT:  The patient has been on Wellbutrin and Celexa, it was taken  off to see if this would improve her tremors, but which has not.  She  has seen a therapist for depression, who also has mentioned to the  family and the patient that she does not think that all her physical  symptoms are related to a depression - anxiety disorder.  Again, MRI and  MRA have not shown abnormalities that is new.  A balance evaluation by  the physical therapist has been performed and the NPH has been ruled  out.  As to her cognitive decline, I think that the patient may develop  early signs of Parkinson disease.  He adds, her tremor is more that of  an essential nature.  I found some vigour and increased tone in her  right biceps with positive range of motion.  I would like for the  patient to try Namenda or Aricept to see if her cognitive function can  be improved or her decline slowed.  The patient would agree to a  neuropsychiatric evaluation by Dr. Leonides Cave with a memory battery and for  an outpatient evaluation by Dr. Anne Hahn for possible parkinsonian  disorders.      Melvyn Novas, M.D.  Electronically  Signed     CD/MEDQ  D:  06/04/2008  T:  06/05/2008  Job:  [id number]   cc:   Kathrynn Running, MD  C. Lesia Sago, M.D.  Gladstone Pih, Ph.D.

## 2010-08-11 NOTE — Discharge Summary (Signed)
[name], [name]                          ACCOUNT NO.:  [id number]   MEDICAL RECORD NO.:  [id number]                   PATIENT TYPE:  INP   LOCATION:  3039                                 FACILITY:  [facility]   PHYSICIAN:  Marlan Palau, M.D.               DATE OF BIRTH:  1940/10/22   DATE OF ADMISSION:  10/04/2002  DATE OF DISCHARGE:  10/06/2002                                 DISCHARGE SUMMARY   ADMISSION DIAGNOSES:  New onset of dizziness, gait instability, rule out  cerebrovascular infarction.   DISCHARGE DIAGNOSES:  1. New onset of acute right cerebellar stroke consistent with [place]     stroke.  2. History of anxiety disorder.   PROCEDURES DURING THIS ADMISSION:  1. MRI scan of the brain.  2. MRI angiogram.  3. Two dimensional echocardiogram.   COMPLICATIONS FROM ABOVE PROCEDURES:  None.   HISTORY OF PRESENT ILLNESS:  [name] is a 70 year old right handed  white female born 11-Feb-1941, with a history of prior anxiety disorder.  The patient came in to the emergency room after noting onset of right  occipital headache, dizziness, vertigo, nausea, vomiting.  The patient a CT  scan of the brain showing some encephalomalacia in the right mid temporal  area, no acute changes were seen.  The patient had taken some aspirin at  home and came to the emergency room.  The patient was felt to have had a  possible stroke event.  The patient was felt to be an adequate candidate for  tPA which was administered.  The patient was admitted to the intensive care  unit.   PAST MEDICAL HISTORY:  1. Significant for new onset of right cerebellar stroke consistent with a     [place] stroke status post tPA.  2. History of anxiety disorder.  3. Right sided sciatica in the past.   MEDICATIONS ON ADMISSION:  1. Lexapro 0.5 mg h.s.  2. Aspirin 81 mg daily.   ALLERGIES:  The patient has no known allergies.   SOCIAL HISTORY:  Does not smoke or drink.   Please refer to  history and physical dictation for social history, family  history, review of systems and physical examination.   LABORATORY DATA:  Notable for a sodium of 139, potassium 3.3, chloride of  107, CO2 21, glucose of 124, BUN of 12, creatinine 0.8, calcium 9.5, total  protein 7.1, albumin of 3.9, AST 31, ALT 13, alkaline phosphatase 87, total  bilirubin 0.1, INR of 0.8.  White count 11,000, hemoglobin 13.2, hematocrit  38.8, platelets of 181,000.  A hemoglobin A1C level and a homocystine level  are pending at the time of this dictation.   EKG reveals normal sinus rhythm, normal EKG, heart rate of 71.   HOSPITAL COURSE:  The patient has done quite well during the course of  hospitalization.  The patient was given tPA and sent  to the intensive care  unit for observation.  The patient underwent an MRI scan of the brain that  confirmed evidence of a small right cerebellar stroke and MRI angiogram  showed no evidence of hemodynamically significant stenosis involving the  carotid artery system.  The right vertebral artery was dominant and mildly  irregularities were noted in the vertical segment of the left vertebral  artery.  Questionable slight narrowing of the left subclavian artery was  noted distal to the takeoff of the left vertebral artery.  This patient  underwent a 2D echocardiogram revealing an ejection fraction to be 55 to  65%.  This was technically difficult due to the presence of breast implants,  mild aortic valvular regurgitation was noted.   The patient was treated with heparin initially, this has been discontinued  and the patient will be started back on aspirin and Plavix will be added for  four to six weeks.  The patient has been seen by physical and occupational,  speech therapy and has done very well.  At the current time, the patient  still complains of a slight headache requiring some Vicodin for this but the  headache is improving.  The patient is ambulatory, has normal  strength in  all four extremities.  No more nausea and vomiting noted.  The patient is  able to perform tandem gait with only some slight unsteadiness, Romberg  negative, no drift is seen.   PLAN:  Will send the patient home today taking the following:  1. Vicodin if needed for headache.  2. Ativan 0.5 mg at h.s. (patient claims that Clonopin causes too much     fatigue and wipe out the next day).  3. Aspirin 325 mg daily.  4. Plavix 75 mg daily.  5. Lexapro 10 mg daily.   The patient is to have no strenuous activity for three to four weeks.  A no  added salt diet.  She will follow up with [facility] in  four weeks for an evaluation.  The patient is felt not to require physical  occupational, speech therapy evaluation as an outpatient.  The patient at  this point claims she feels essentially normal and feels that she has no  subjective deficits.  The patient has run some low-grade temperatures during  this hospitalization however.                                                Marlan Palau, M.D.    CKW/MEDQ  D:  10/06/2002  T:  10/07/2002  Job:  [id number]

## 2010-08-11 NOTE — Procedures (Signed)
. [facility]  Patient:    [name], [name] Visit Number: [id number] MRN: [id number]          Service Type: OUT Location: [facility] Attending Physician:  Fenton Malling Dictated by:   Kelli Hope, M.D. Proc. Date: 02/12/01 Admit Date:  02/12/2001                             Procedure Report  DATE OF BIRTH:  December 28, 1940.  PROCEDURE:  Diagnostic lumbar puncture.  INDICATION:  Abnormal MRI.  Rule out infection, vasculitis, malignancy, or demyelinating disorder.  OPERATOR:  Kelli Hope, M.D.  DESCRIPTION OF PROCEDURE:  Informed consent was signed and placed in the patients chart after the indications, risks, and benefits of the procedure were explained to the patient and she agreed to proceed.  The patient was placed in the right lateral decubitus position and prepped and draped in the usual sterile fashion.  Local anesthesia was achieved with 2 cc of lidocaine. A 20-gauge spinal needle was inserted into the L3-4 interspace and advanced until return of clear CSF was seen.  Opening pressure was 160 mmH2O. Approximately 11 cc of clear CSF were obtained and sent to the laboratory for the following studies:  Tube #1, 2 cc, cell count, differential, cryptococcal antigen; tube 2, 2 cc, protein, glucose, immunoelectrophoresis, oligoclonal bands; tube #3, 2 cc, Gram stain, routine culture, AFB stain and culture, fungal stain and culture; tube #4, 5 cc, cytology.  Closing pressure was measured at 130 mmH2O.  The needle was withdrawn and hemostasis was obtained. No immediate complications were noted.  The patient was advised to lie flat for an hour and then will be discharged home.  She is advised to notify the office immediately if she develops fever or stiff neck and is to let us know if over the next couple of day she developed a postural headache.  One red top of blood was sent to the lab with CSF for oligoclonal band testing. Dictated by:    Kelli Hope, M.D. Attending Physician:  Fenton Malling DD:  02/12/01 TD:  02/12/01 Job: [id number]

## 2010-08-11 NOTE — Discharge Summary (Signed)
[name], [name]                          ACCOUNT NO.:  [id number]   MEDICAL RECORD NO.:  [id number]                   PATIENT TYPE:  INP   LOCATION:  5016                                 FACILITY:  [facility]   PHYSICIAN:  Sherin Quarry, MD                   DATE OF BIRTH:  09-13-1940   DATE OF ADMISSION:  03/24/2002  DATE OF DISCHARGE:  03/26/2002                                 DISCHARGE SUMMARY   HISTORY OF PRESENT ILLNESS:  The patient is a 70 year old lady who reports  that on March 18, 2002, she began to experience an illness characterized  by fever, myalgias, and cough.  Because of these symptoms, she took a  variety of medications included two Motrin t.i.d.  Also Augmentin was called  in for her to take on a b.i.d. schedule.  For two days prior to admission,  the patient experienced frequent melanotic stools.  On the morning of  admission, she became nauseated and vomited bright red blood x1.  She  presented to the emergency room where she was noted to have a hemoglobin of  7.8 and a BUN of 35 consistent with GI bleeding.  She was therefore admitted  for further evaluation.   PHYSICAL EXAMINATION AT TIME OF ADMISSION:  GENERAL APPEARANCE:  The patient  was a somewhat pale-appearing woman who was weak and felt slightly dizzy.  VITAL SIGNS:  Blood pressure 100/60, pulse 84, respiratory rate 16.  HEENT:  This was remarkable for facial pallor.  CHEST:  Clear.  BACK:  No CVA or point tenderness.  CARDIOVASCULAR:  Normal S1 and S2 without murmurs, rubs, or gallops.  ABDOMEN:  Benign with normal bowel sounds without masses or tenderness.  No  guarding or rebound.  RECTAL:  Stool was Hematest positive.  EXTREMITIES:  Normal.  NEUROLOGIC:  Normal.   LABORATORY DATA:  Relevant laboratory studies obtained included PT and PTT  which were within normal limits.  As previously mentioned, the hemoglobin  was 7.1, hematocrit 21.3, white count 10,400.  The patient was subsequently  transfused three units of blood.  Hemoglobin came up to 10.9, hematocrit  32.1.  Basic metabolic profile showed sodium 136, potassium 3.8, glucose 89,  CO2 24.  Liver profile was normal.   HOSPITAL COURSE:  Consultation was obtained from Dr. Danise Edge of the  GI service, who performed diagnostic endoscopy on the patient on March 25, 2002.  This revealed a 2 x 2 mm prepyloric gastric antral ultrasound  with an exudative base.  It was not visibly bleeding. The duodenum appeared  normal.  A biopsy was obtained and is pending at this time.  Dr. Laural Benes  recommended that the patient have Protonix 40 mg daily for 12 weeks and that  a repeat endoscopy be done in 12 weeks.  On March 26, 2002, the patient was  feeling well and the decision was  made to discharge the patient at that  time.   DISCHARGE DIAGNOSES:  1. Gastric antral ulcer.  2. Gastrointestinal bleeding secondary to #1.  3. History of febrile illness, possibly influenza.  4. History of negative brain biopsy.  5. History of lithotripsy.  6. History of depression.   DISCHARGE INSTRUCTIONS:  The patient was advised to continue her usual  medications; i.e., Lexapro and Klonopin.  She was advised to refrain from  taking any nonsteroidal anti-inflammatory drugs.  She was advised to take  Protonix 40 mg daily for a period of 12 weeks.  As previously noted, she was  advised to have a repeat endoscopy in 12 weeks with Dr. Laural Benes. This can be  scheduled by Dr. Malon Kindle office.                                               Sherin Quarry, MD    SY/MEDQ  D:  03/26/2002  T:  03/26/2002  Job:  [id number]   cc:   Jethro Bastos, M.D.  [address]  [place]  [place] [address]  Fax: [phone]   Danise Edge, M.D.  [address]  [place]  [place] [address]  Fax: [phone]

## 2010-08-11 NOTE — H&P (Signed)
[place]. [facility]  Patient:    [name], [name] Visit Number: [id number] MRN: [id number]          Service Type: SUR Location: 3100 3109 [id number] Attending Physician:  Barton Fanny Dictated by:   Hewitt Shorts, M.D. 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 She began to have difficulties with dizziness about a year and a half ago.  It worsened sufficiently since then.  In July of 2002, she was unable to drive. She denies any other symptoms.  She was evaluated by her primary physician who suspected anxiety and prescribed Celexa and clonazepam.  These medications have made her feel lethargic and sleepy and not as sharp intellectually.  She pursued further evaluation with MRI, underwent neurology consultation and was suspected to have agoraphobia, however, an MRI was performed and it showed a subtle abnormality on the fluoroscopic images in the right posterior frontal region.  The first scan was done in September.  Repeat scans were done in October and December, with each sequential scan showing more prominence of the abnormal signal in the right posterior frontal region on the fluoroscopic images.  The lesion does not enhance with contrast, nor are T2 images abnormal.  Other than some dizziness, she has not had any adverse symptoms and, in fact, when she was evaluated, 95% of the dizziness had resolved and it has been gradually improving since it was worst in July of 2002.  She denies any seizures, headaches, weakness, speech difficulties or other neurologic dysfunction.  In addition to her MRI, she has undergone lumbar puncture and cerebral angiogram; the lumbar puncture was unremarkable and the angiogram showed no evidence of vasculitis or other vascular  lesion.  Patient is admitted now for craniotomy and subtotal resection of tumor.  PAST MEDICAL HISTORY:  She does not describe any history of hypertension, myocardial infarction, cancer, stroke, diabetes, peptic ulcer disease or lung disease.  PAST SURGICAL HISTORY:  Previous surgery includes a tubal ligation in 1996 and breast implants in 1975.  ALLERGIES:  She denies allergies to medications.  CURRENT MEDICATIONS: 1. Lexapro 10 mg q.d. for anxiety. 2. Clonazepam 0.5 mg q.d. for anxiety. 3. Prempro 0.625 mg q.d. for estrogen replacement. 4. Vitamin C. 5. Calcium with vitamin D. 6. Multivitamin. 7. Aspirin. 8. Rhinocort Aqua p.r.n. for allergies. 9. Ibuprofen p.r.n. for muscle pain.  FAMILY HISTORY:  Her mother died at age 58; she had diabetes.  Her father has parkinsonism and dementia and is 72 years old.  SOCIAL HISTORY:  Patient is married.  She does real Art gallery manager.  She does not smoke, drink alcoholic beverages or have a history of substance abuse. She and her husband are both Mormons.  REVIEW OF SYSTEMS:  Review of systems is notable for those difficulties described in her history of present illness and past medical history but is otherwise unremarkable.  PHYSICAL EXAMINATION:  GENERAL:  Patient is a well-developed, well-nourished white female in no acute distress.  VITAL SIGNS:  Her temperature is 97, pulse is 71, blood pressure 110/67, respiratory rate 20.  Height 5 feet 3 inches.  Weight 155 pounds.  LUNGS:  Clear to auscultation.  She has symmetrical respiratory excursion.  HEART:  Regular rate and rhythm.  Normal S1 and S2.  There is no murmur.  ABDOMEN:  Soft and nondistended.  Bowel sounds are present.  EXTREMITIES:  No clubbing, cyanosis, or edema.  NEUROLOGIC:  On mental status, patient is awake, alert, fully oriented.  Her speech is full and she has good comprehension.  Cranial nerves show pupils to be equal, round and reactive to light and about  3 mm bilaterally.  Extraocular movements are intact.  Facial sensation is intact.  Facial movement is symmetrical and hearing is intact bilaterally.  Palatal movement is symmetrical.  Shoulders shrug equally and tongue protrudes in the midline. Motor examination shows 5/5 strength in the upper and lower extremities.  She has no drift in the upper extremities.  Sensation is intact to pinprick throughout.  Reflexes are 1 at biceps, brachialis, triceps, quadriceps and gastrocnemii and they are symmetrical bilaterally.  Toes are downgoing bilaterally.  Her gait is steady but she has mild-to-moderate unsteadiness with tandem gait.  She has a negative Romberg.  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  She is neurologically intact but her MRI scans have shown progressive enlargement of this lesion over a four-month period of time.  RECOMMENDATIONS:  I discussed my assessment and impression with patient and her husband and alternatives for continued monitoring versus subtotal excision.  The Stealth stereotaxy was discussed.  Patient does want to go ahead with subtotal resection and is admitted for such.  We discussed the nature of the procedure, typical length of surgery, hospital stay and overall recuperation and limitations postoperatively and risks of surgery to include risks of infection and bleeding with possibility for transfusion, the risk of neurologic dysfunction, particularly weakness of the left face or arm and hand, and anesthetic risks of myocardial infarction, stroke, pneumonia and death.  Understanding all of this, she does wish to go ahead with surgery and is admitted for such. Dictated by:   Hewitt Shorts, M.D. Attending Physician:  Barton Fanny DD:  04/16/01 TD:  04/17/01 Job: [id number]

## 2010-08-11 NOTE — H&P (Signed)
[name], [name]                          ACCOUNT NO.:  [id number]   MEDICAL RECORD NO.:  [id number]                   PATIENT TYPE:  INP   LOCATION:  1823                                 FACILITY:  [facility]   PHYSICIAN:  Deanna Artis. Sharene Skeans, M.D.           DATE OF BIRTH:  1940-09-26   DATE OF ADMISSION:  10/04/2002  DATE OF DISCHARGE:                                HISTORY & PHYSICAL   CHIEF COMPLAINT:  Headache, dizziness, nausea, and vomiting.   HISTORY OF PRESENT CONDITION:  The patient is a 70 year old, right-handed,  Caucasian, married woman who laid down for a nap because she was tired at  around 1:30 this afternoon.  She awakened at 4:30, got up, went to the  bathroom, and then, after awake and seemingly normal, had sudden onset of  right occipital headache.  It was associated with sudden onset of dizziness,  vertigo, nausea, and vomiting x 3.  This began around 16:30.  She presented  to the emergency room.  Cranial CT scan showed encephalomalacia in the right  mid temple region.  There was no evidence of an acute event, no hemorrhage,  and no sign of a vascular occlusion.   The patient had an episode of tunnel vision in 2001 which led to an MRI scan  which showed a lesion in the right temple region.  This slowly grew over a  period of several months, and she underwent biopsy February 2002.  The  biopsy showed evidence of scar tissue.  Dr. Jacki Cones initially  evaluated the patient and stent the patient to Dr. Autumn Messing who did the  biopsy, and then the patient was sent to [facility] for  a second opinion.  Etiology of this is unknown.  The patient has had serial  MRI scans every six months without change in the lesion.   When the patient recognized that she may have had a stroke, she took 4 baby  aspirin.  She was encouraged to come to the emergency room.  There was some  confusion on my part as to exactly when her symptoms began.  I felt that  she  told me that she had awakened with a headache.  After she arrived in the  emergency room, however, she said that she clearly awakened feeling well and  then had onset of her symptoms.   REVIEW OF SYSTEMS:  Remarkable for right sciatica.  The patient has  anxiety/depression.   PAST MEDICAL HISTORY:  See above.   PAST SURGICAL HISTORY:  Right temple craniotomy February 2002.   CURRENT MEDICATIONS:  1. Lexapro 10 mg per day.  2. Clonazepam 0.5 mg at bedtime.  3. Aspirin 81 mg per day.   ALLERGIES:  None known.   FAMILY HISTORY:  Mother died of myocardial infarction in her 37s.  Her  father is 46 and is having some problems walking.  Maternal grandfather had  a stroke.  No history of diabetes or cancer.   SOCIAL HISTORY:  The patient does not use tobacco or alcohol.  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   PHYSICAL EXAMINATION:  GENERAL:  On examination today, a pleasant woman,  tired, in no acute distress.  VITAL SIGNS:  Blood pressure 143/74, resting pulse 77, respirations 18,  pulse oximetry 100%.  HEENT:  Ears, nose, and throat: No signs of infection.  NECK:  Supple.  No meningismus, no bruits.  LUNGS:  Clear.  HEART:  No murmurs.  Pulses normal.  ABDOMEN:  Soft.  Bowel sounds normal.  No hepatosplenomegaly.  EXTREMITIES:  Unremarkable.  NEUROLOGIC:  She has positive straight leg raising in the right leg.  She is  awake, anxious, without dysphagia or dyspraxia or dysarthria.  Cranial  nerves: Round, reactive pupils.  Fundi normal.  Visual fields full to double  simultaneous stimuli.  Symmetric facial strength.  Midline tongue and uvula.  Air conduction greater than bone conduction bilaterally.  Motor examination:  Normal strength, tone, and mass.  Good fine motor movements, no drift.  Sensation without peripheral neuropathy.  Good stereoagnosis.  Cerebellar  examination:  Good finger-to-nose, rapid repetitive movements.  Gait not  tested.  Deep  tendon reflexes normal.  The patient has bilateral flexor  plantar responses.   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   PLAN:  Will use tPA.  This was instilled as a bolus at 2 hours and 50  minutes after her symptoms began.  We will then give her the remainder over  1 hours.  She will be placed in ICU.  We will evaluate her with an MRI of  the brain without and with contrast, MRA intracranial and extracranial, 2-D  echocardiogram, serum fasting lipid and homocystine as well as hemoglobin  A1C.  We will involved therapist as appears appropriate and necessary.                                               Deanna Artis. Sharene Skeans, M.D.    [facility]  D:  10/04/2002  T:  10/05/2002  Job:  [id number]   cc:   Jethro Bastos, M.D.  [address]  [place]  [place] [address]  Fax: [phone]   Marolyn Hammock. Thad Ranger, M.D.  [address]  [address]  [place]  [place] [address]  Fax: [phone]   Hewitt Shorts, M.D.  [address]  [place]  [place] [address]  Fax: [phone]    cc:   Jethro Bastos, M.D.  [address]  [place]  [place] [address]  Fax: [phone]   Marolyn Hammock. Thad Ranger, M.D.  [address]  [address]  [place]  [place] [address]  Fax: [phone]   Hewitt Shorts, M.D.  [address]  [place]  [place] [address]  Fax: [phone]

## 2010-08-11 NOTE — Op Note (Signed)
NAMEAMYIAH, GABA                          ACCOUNT NO.:  [id number]   MEDICAL RECORD NO.:  [id number]                   PATIENT TYPE:  INP   LOCATION:  2112                                 FACILITY:  [facility]   PHYSICIAN:  Danise Edge, M.D.                DATE OF BIRTH:  Dec 03, 1940   DATE OF PROCEDURE:  03/25/2002  DATE OF DISCHARGE:                                 OPERATIVE REPORT   PROCEDURE PERFORMED:  Diagnostic esophagogastroduodenoscopy.   ENDOSCOPIST:  Charolett Bumpers, M.D.   REFERRING PHYSICIAN:  Sherin Quarry, MD   INDICATIONS FOR PROCEDURE:  The patient is a 70 year old female born  December 24, 1940.  Over Christmas, the patient developed an upper  respiratory tract infection type syndrome manifested myalgias and cough.  To  treat her symptoms, she was using Tylenol PM, NyQuil, Tussionex and Motrin.  She then began passing melenic appearing stool and had one episode of  vomiting bright blood.  She denies a past history of peptic ulcer disease.  Approximately 10 years ago she underwent a screening colonoscopy and was  told exam was normal;  she does not remember the physician who did her  colonoscopy.   I discussed with the patient the complications associated with  esophagogastroduodenoscopy including intestinal bleeding and intestinal  perforation.  The patient has signed the operative permit.   PROCEDURE PERFORMED:  Esophagogastroduodenoscopy.   ENDOSCOPIST:  Charolett Bumpers, M.D.   MEDICATION ALLERGIES:  None.   CURRENT CHRONIC MEDICATIONS:  1. Lexapro.  2. Klonopin.   PAST MEDICAL HISTORY:  Screening colonoscopy 10 years ago; I do not have the  typed report.  Brain biopsy April 16, 2001 performed by Dr. Newell Coral  revealing scar tissue.  Lithotripsy for kidney stones September 2002.  Anxiety.   PREMEDICATION:  Versed 5 mg, Demerol 50 mg.   INSTRUMENT USED:  Olympus gastroscope.   DESCRIPTION OF PROCEDURE:  After obtaining informed consent,  the patient was  placed in the left lateral decubitus position.  I administered intravenous  Demerol and intravenous Versed to achieve conscious sedation for the  procedure.  The patient's blood pressure, oxygen saturations and cardiac  rhythm were monitored throughout the procedure and documented in the medical  record.   The Olympus gastroscope was passed through the posterior hypopharynx and the  proximal esophagus without difficulty.  The hypopharynx, larynx and vocal  cords appeared normal endoscopically.   Esophagoscopy:  The proximal, mid and lower segments of the esophagus appear  completely normal.  There are no signs of bleeding from the esophagus.  Endoscopically, there is no evidence of a Mallory-Weiss tear, erosive  esophagitis, Barrett's esophagus, esophageal mucosal scarring or esophageal  ulceration.   Gastroscopy:  Retroflex view of the gastric cardia and fundus was normal.  The gastric body appeared normal.  There is a small isolated erosion in the  proximal gastric antrum on the greater curvature  aspect which does not  appear to be the source of any bleeding.  In the prepyloric antrum hidden  within a fold there is a 2 mm x 2 mm ulcer with exudative base.  There are  no visible vessels and no active bleeding.  The endoscope has to be pushed  into the fold in order to demonstrate the ulcer base which is not visible  without performing this maneuver.  The pylorus is normal.  A biopsy was  taken from the distal gastric antrum for CLO test to rule out Helicobacter  pylori antral gastritis.   Duodenoscopy:  The duodenal bulb, midduodenum and distal duodenum appear  completely normal.   ASSESSMENT:  Small isolated erosion in the distal gastric antrum and a 2 mm  x 2 mm benign appearing prepyloric gastric antral ulcer hidden within a fold  in the prepyloric gastric antral area on the lesser curvature aspect.   RECOMMENDATIONS:  The patient's prepyloric gastric antral  ulcer is at low  risk for rebleeding.  She should remain off nonsteroidal anti-inflammatory  medication.  She can start a regular diet.  She should take Protonix 40 mg  each morning for 12 weeks.  I will plan to repeat her  esophagogastroduodenoscopy in 12 weeks to confirm gastric antral ulcer  healing.  If there are no signs of further bleeding, she can safely be  discharged on March 26, 2002.                                                 Danise Edge, M.D.    [facility]  D:  03/25/2002  T:  03/25/2002  Job:  [id number]   cc:   Jethro Bastos, M.D.  [address]  [place]  [place] [address]  Fax: [phone]   Sherin Quarry, MD  [address]  [place]  [place] [address]  Fax: [phone]

## 2010-08-11 NOTE — Discharge Summary (Signed)
[place]. [facility]  Patient:    [name], [name] Visit Number: [id number] MRN: [id number]          Service Type: SUR Location: 3100 3102 [id number] Attending Physician:  Barton Fanny Dictated by:   Hewitt Shorts, M.D. Admit Date:  04/16/2001 Discharge Date: 04/19/2001                             Discharge Summary  HISTORY OF PRESENT ILLNESS:  The patient is a 70 year old woman who presented with symptoms of dizziness and was found to have an abnormal lesion in the posterior aspect of the right frontal lobe on MRI scan, specifically, flare images.  MRI has shown progressive increase in these changes.  Lumbar puncture and cerebral angiogram were negative.  PHYSICAL EXAMINATION:  GENERAL:  Unremarkable.  NEUROLOGIC:  Intact.  HOSPITAL COURSE:  The patient was admitted, underwent right frontotemporal parietal craniotomy with Stealth stereotaxy and subtotal resection of tumor. Pathology determination remains pending at this time.  She has done well postoperatively.  Is awake, alert, with intact speech.  She has steadily made progress with increasing her activities.  Her wound is healing nicely.  She is afebrile.  She is up and ambulating.  We are discharging her to home.  DISCHARGE INSTRUCTIONS:    She is being discharged to home with instructions regarding wound care and activities.  FOLLOW-UP:   She is to return to my office in one week for staple removal. Hopefully, the pathology will be back by that time, and we will be able to review it and reveal the plan of further treatment if necessary.  DISCHARGE MEDICATIONS:  Discharge prescription has been given for Decadron 4 mg tablets.  She is to take 1 tablet b.i.d. on January 25 and January 26, one-half tablet b.i.d. on January 27 and January 28, one-half tablet q.d. on January 29 and January 30, and then one-half tablet q.o.d. on February 1, February 3, February 5, and May 02, 2001, then  she is to discontinue it. A total of eight tablets were prescribed.  She is also being given Pepcid 20 mg b.i.d., 30 tablets, no refills.  We are going to give her a prescription for Dilantin 100 mg capsules.  She is to take 2 b.i.d.  We prescribed 125 tablets and five refills.  DISCHARGE DIAGNOSIS:  Brain tumor. Dictated by:   Hewitt Shorts, M.D. Attending Physician:  Barton Fanny DD:  04/19/01 TD:  04/20/01 Job: [id number]

## 2010-08-11 NOTE — Op Note (Signed)
[place]. [facility]  Patient:    [name], [name] Visit Number: [id number] MRN: [id number]          Service Type: SUR Location: 3100 3109 01 Attending Physician:  Barton Fanny Dictated by:   Hewitt Shorts, M.D. Proc. Date: 04/16/01 Admit Date:  04/16/2001                             Operative Report  PREOPERATIVE DIAGNOSIS:  Right posterior frontal brain tumor.  POSTOPERATIVE DIAGNOSIS:  Right posterior frontal brain tumor.  PROCEDURE:  Right frontotemporoparietal craniotomy and subtotal resection of tumor with microdissection and microsurgery.  SURGEON:  Hewitt Shorts, M.D.  ASSISTANT:  Mena Goes. Franky Macho, M.D.  ANESTHESIA:  General endotracheal.  INDICATION:  The patient is a 70 year old woman who presented with dizziness and was found by MRI scan to have a steadily-worsening lesion in the right posterior frontal region, most likely involving the right premotor cortex.  A decision made to proceed with subtotal resection of this lesion for treatment and diagnosis.  DESCRIPTION OF PROCEDURE:  Patient brought to the operating room, placed under general endotracheal anesthesia.  A Stealth MRI scan had been done prior to surgery and had been loaded, and preoperative Stealth stereotaxy planning was performed.  The patient was placed in the three-pin [place] head holder, a roll was placed beneath the right shoulder, and the head was gently turned to the left.  The Stealth system was assembled and preoperative set-up was performed.  We were able to identify the overlying scalp and then shaved the right pterional region.  It was prepped with Betadine soap and solution, draped in a sterile fashion.  A reverse question mark right frontotemporoparietal craniotomy was turned.  The scalp was infiltrated with local anesthetic with epinephrine.  The scalp opened.  Rainey clips were applied to the scalp edges.  The temporalis fascia and muscle  was vertically split and elevated off of the scalp, and then a bur hole was made in the lower temporal region.  The dura was dissected from the overlying skull, and then the craniotome attachment was used to turn a bone flap.  We did use the Stealth stereotaxy throughout the procedure to localize the lesion relative to our exposure.  Once the bone flap was elevated, dural tack-up sutures were placed around the margins of the craniotomy and then the dura was opened in a U-shaped fashion, hinged toward the midline.  The brain was relaxed and pulsating, and the surface appeared unremarkable.  However, using the Stealth stereotaxy we were able to identify the gyrus that was involved with the lesion.  We then draped the microscope and brought it into the field to provide additional magnification, illumination, and visualization, and the remainder of the resection was performed using microdissection and microsurgical technique.  The arachnoid overlying the surrounding sulci was carefully opened.  Small vessels bridging to the lesion were coagulated and incised.  We were able to dissect around the involved gyrus and then dissect across the gyrus at a depth of a little over a centimeter.  The entire involved gyrus was sent to pathology for permanent section.  The resection cavity was checked for hemostasis, which was established with the use of bipolar cautery.  We then lined the resection cavity with Surgicel and then proceeded with closure.  The dura was closed with interrupted 4-0 Nurolon sutures, the bone flap was secured in place with three  medium-sized straight Osteomed plates using 4 mm screws.  The bone flap was secured, and then we closed the temporalis fascia and muscle and then proceeded with scalp closure with interrupted, inverted 2-0 undyed Vicryl sutures closing the galea and surgical staples closing the skin edges.  The wound was dressed with Adaptic and sterile gauze, and the head  was wrapped with a Kling.  The patient tolerated the procedure well.  Following surgery, she was taken out of the three-pin [place] head holder, to be reversed from the anesthetic.  The estimated blood loss was less than 25 cc.  Sponge and needle count were correct. Dictated by:   Hewitt Shorts, M.D. Attending Physician:  Barton Fanny DD:  04/16/01 TD:  04/17/01 Job: [id number]

## 2011-01-01 ENCOUNTER — Encounter: Payer: Self-pay | Admitting: Gynecology

## 2011-01-01 DIAGNOSIS — I639 Cerebral infarction, unspecified: Secondary | ICD-10-CM | POA: Insufficient documentation

## 2011-01-01 DIAGNOSIS — N870 Mild cervical dysplasia: Secondary | ICD-10-CM | POA: Insufficient documentation

## 2011-01-01 DIAGNOSIS — N200 Calculus of kidney: Secondary | ICD-10-CM | POA: Insufficient documentation

## 2011-01-05 ENCOUNTER — Ambulatory Visit (INDEPENDENT_AMBULATORY_CARE_PROVIDER_SITE_OTHER): Payer: Medicare Other | Admitting: Obstetrics and Gynecology

## 2011-01-05 ENCOUNTER — Encounter: Payer: Self-pay | Admitting: Obstetrics and Gynecology

## 2011-01-05 VITALS — BP 124/80 | Ht 63.0 in | Wt 124.0 lb

## 2011-01-05 DIAGNOSIS — M25559 Pain in unspecified hip: Secondary | ICD-10-CM

## 2011-01-05 DIAGNOSIS — M8580 Other specified disorders of bone density and structure, unspecified site: Secondary | ICD-10-CM

## 2011-01-05 DIAGNOSIS — M899 Disorder of bone, unspecified: Secondary | ICD-10-CM

## 2011-01-05 DIAGNOSIS — N63 Unspecified lump in unspecified breast: Secondary | ICD-10-CM

## 2011-01-05 DIAGNOSIS — N952 Postmenopausal atrophic vaginitis: Secondary | ICD-10-CM

## 2011-01-05 MED ORDER — ESTRADIOL 10 MCG VA TABS: ORAL_TABLET | VAGINAL | Status: DC

## 2011-01-05 NOTE — Progress Notes (Signed)
Subjective:     Patient ID: [name], female   DOB: [birth year], 70 y.o.   MRN: [id number]  HPIpatient came to see me today for further GYN followup. Since I last seen her she is gotten married. She was having atrophic vaginitis with dyspareunia but was in [place] and was treated with Vagifem with excellent results. She uses it 3 times a week. She is having no vaginal bleeding. She is not having pelvic pain but she is having left hip pain and wanted to be sure it was related to her pelvis. She has a history of low bone mass with a borderline FRAX risk on her hip of 2.8%. Many years ago she took Fosamax but had some side effects from it. We have discussed Atelvia but she declined. It is time for another bone density and she has an appointment. In addition she's previously had leakage from her left breast implant but elected not to have surgical correction. She now sees a difference in her right breast and wonders if she's leaking from that breast as well. She's not having true menopausal symptoms. She has a history of CIN-1 but her last Pap smear been normal.   Review of Systems  Constitutional: Negative.   HENT: Negative.   Eyes: Negative.   Respiratory: Negative.   Cardiovascular: Positive for chest pain.  Gastrointestinal: Negative.        History of indigestion and hemorrhoids  Genitourinary: Negative.        History of kidney stones  Musculoskeletal: Positive for back pain.  Skin: Negative.   Neurological:       History of stroke  Psychiatric/Behavioral: Negative.        Objective:   Physical ExamPatti present for exam. Physical examination: HEENT within normal limits. Neck: Thyroid not large. No masses. Supraclavicular nodes: not enlarged. Breasts: Examined in both sitting midline position. No skin changes and no masses. Abdomen: Soft no guarding rebound or masses or hernia. Pelvic: External: Within normal limits. BUS: Within normal limits. Vaginal:within normal limits. Good  estrogen effect. No evidence of cystocele rectocele or enterocele. Cervix: clean. Uterus: Normal size and shape. Adnexa: No masses. Rectovaginal exam: Confirmatory and negative. Extremities: Within normal limits.     Assessment:     #1. Osteopenia #2. Hip pain #3. Atrophic vaginitis #4. Possible leaking implant on right breast    Plan:     Continue Vagifem 3 times a week. Schedule mammogram. Bone density. Consult with PCP regarding hip pain. Suggested immediate cardiac assessment and she will do that so her PCP.

## 2011-01-31 ENCOUNTER — Other Ambulatory Visit: Payer: Self-pay | Admitting: Obstetrics and Gynecology

## 2011-01-31 DIAGNOSIS — M8580 Other specified disorders of bone density and structure, unspecified site: Secondary | ICD-10-CM

## 2011-02-01 ENCOUNTER — Ambulatory Visit (INDEPENDENT_AMBULATORY_CARE_PROVIDER_SITE_OTHER): Payer: Medicare Other

## 2011-02-01 ENCOUNTER — Encounter: Payer: Self-pay | Admitting: Obstetrics and Gynecology

## 2011-02-01 DIAGNOSIS — M8580 Other specified disorders of bone density and structure, unspecified site: Secondary | ICD-10-CM

## 2011-02-01 DIAGNOSIS — M949 Disorder of cartilage, unspecified: Secondary | ICD-10-CM

## 2011-06-04 ENCOUNTER — Inpatient Hospital Stay (HOSPITAL_COMMUNITY)
Admission: EM | Admit: 2011-06-04 | Discharge: 2011-06-06 | DRG: 065 | Disposition: A | Discharge status: Home / Self Care | Payer: Medicare Other | Attending: Internal Medicine | Admitting: Internal Medicine

## 2011-06-04 ENCOUNTER — Other Ambulatory Visit: Payer: Self-pay

## 2011-06-04 ENCOUNTER — Emergency Department (HOSPITAL_COMMUNITY): Payer: Medicare Other

## 2011-06-04 ENCOUNTER — Encounter (HOSPITAL_COMMUNITY): Payer: Self-pay | Admitting: Emergency Medicine

## 2011-06-04 ENCOUNTER — Inpatient Hospital Stay (HOSPITAL_COMMUNITY): Payer: Medicare Other

## 2011-06-04 DIAGNOSIS — Z8673 Personal history of transient ischemic attack (TIA), and cerebral infarction without residual deficits: Secondary | ICD-10-CM

## 2011-06-04 DIAGNOSIS — R42 Dizziness and giddiness: Secondary | ICD-10-CM

## 2011-06-04 DIAGNOSIS — Q2111 Secundum atrial septal defect: Secondary | ICD-10-CM | POA: Diagnosis present

## 2011-06-04 DIAGNOSIS — Q2112 Patent foramen ovale: Secondary | ICD-10-CM

## 2011-06-04 DIAGNOSIS — M810 Age-related osteoporosis without current pathological fracture: Secondary | ICD-10-CM | POA: Diagnosis present

## 2011-06-04 DIAGNOSIS — Q211 Atrial septal defect: Secondary | ICD-10-CM | POA: Diagnosis present

## 2011-06-04 DIAGNOSIS — I639 Cerebral infarction, unspecified: Secondary | ICD-10-CM | POA: Diagnosis present

## 2011-06-04 DIAGNOSIS — Z87898 Personal history of other specified conditions: Secondary | ICD-10-CM

## 2011-06-04 DIAGNOSIS — Z79899 Other long term (current) drug therapy: Secondary | ICD-10-CM

## 2011-06-04 DIAGNOSIS — I6340 Cerebral infarction due to embolism of unspecified cerebral artery: Principal | ICD-10-CM | POA: Diagnosis present

## 2011-06-04 DIAGNOSIS — F3289 Other specified depressive episodes: Secondary | ICD-10-CM | POA: Diagnosis present

## 2011-06-04 DIAGNOSIS — Z7982 Long term (current) use of aspirin: Secondary | ICD-10-CM

## 2011-06-04 DIAGNOSIS — F329 Major depressive disorder, single episode, unspecified: Secondary | ICD-10-CM | POA: Diagnosis present

## 2011-06-04 HISTORY — DX: Gastro-esophageal reflux disease without esophagitis: K21.9

## 2011-06-04 HISTORY — DX: Encounter for other specified aftercare: Z51.89

## 2011-06-04 HISTORY — DX: Neoplasm of unspecified behavior of brain: D49.6

## 2011-06-04 HISTORY — DX: Fibromyalgia: M79.7

## 2011-06-04 HISTORY — DX: Unspecified osteoarthritis, unspecified site: M19.90

## 2011-06-04 HISTORY — DX: Reserved for inherently not codable concepts without codable children: IMO0001

## 2011-06-04 LAB — BASIC METABOLIC PANEL
BUN: 14 mg/dL (ref 6–23)
Chloride: 104 mEq/L (ref 96–112)
Creatinine, Ser: 0.85 mg/dL (ref 0.50–1.10)
GFR calc non Af Amer: 67 mL/min — ABNORMAL LOW (ref >90)
Glucose, Bld: 77 mg/dL (ref 70–99)
Potassium: 3.3 mEq/L — ABNORMAL LOW (ref 3.5–5.1)

## 2011-06-04 LAB — HEMOGLOBIN A1C
Hgb A1c MFr Bld: 6.1 % — ABNORMAL HIGH (ref <5.7)
Mean Plasma Glucose: 128 mg/dL — ABNORMAL HIGH (ref <117)

## 2011-06-04 LAB — CBC
HCT: 36 % (ref 36.0–46.0)
Hemoglobin: 12.1 g/dL (ref 12.0–15.0)
MCHC: 33.6 g/dL (ref 30.0–36.0)
MCV: 93 fL (ref 78.0–100.0)
Platelets: 157 10*3/uL (ref 150–400)
RBC: 3.92 MIL/uL (ref 3.87–5.11)
WBC: 5.7 10*3/uL (ref 4.0–10.5)

## 2011-06-04 LAB — CREATININE, SERUM
Creatinine, Ser: 0.74 mg/dL (ref 0.50–1.10)
GFR calc Af Amer: >90 mL/min (ref >90)
GFR calc non Af Amer: 83 mL/min — ABNORMAL LOW (ref >90)

## 2011-06-04 MED ORDER — ONDANSETRON HCL 4 MG/2ML IJ SOLN: 4.0000 mg | Freq: Four times a day (QID) | INTRAMUSCULAR | Status: DC | PRN

## 2011-06-04 MED ORDER — SENNOSIDES-DOCUSATE SODIUM 8.6-50 MG PO TABS: 1.0000 | ORAL_TABLET | Freq: Every evening | ORAL | Status: DC | PRN

## 2011-06-04 MED ORDER — CLOPIDOGREL BISULFATE 75 MG PO TABS
75.0000 mg | ORAL_TABLET | Freq: Every day | ORAL | Status: DC
  Administered 2011-06-05 – 2011-06-06 (×2): 75 mg via ORAL
  Filled 2011-06-04 (×4): qty 1

## 2011-06-04 MED ORDER — ONDANSETRON HCL 4 MG/2ML IJ SOLN: 4.0000 mg | Freq: Three times a day (TID) | INTRAMUSCULAR | Status: AC | PRN

## 2011-06-04 MED ORDER — DIAZEPAM 5 MG PO TABS: 5.0000 mg | ORAL_TABLET | Freq: Three times a day (TID) | ORAL | Status: AC | PRN

## 2011-06-04 MED ORDER — HEPARIN SODIUM (PORCINE) 5000 UNIT/ML IJ SOLN
5000.0000 [IU] | Freq: Three times a day (TID) | INTRAMUSCULAR | Status: DC
  Administered 2011-06-04 – 2011-06-06 (×6): 5000 [IU] via SUBCUTANEOUS
  Filled 2011-06-04 (×9): qty 1

## 2011-06-04 MED ORDER — SODIUM CHLORIDE 0.9 % IV SOLN: INTRAVENOUS | Status: AC

## 2011-06-04 MED ORDER — ZOLPIDEM TARTRATE 5 MG PO TABS
5.0000 mg | ORAL_TABLET | Freq: Once | ORAL | Status: AC
  Administered 2011-06-04: 5 mg via ORAL
  Filled 2011-06-04: qty 1

## 2011-06-04 MED ORDER — ASPIRIN 325 MG PO TABS
325.0000 mg | ORAL_TABLET | Freq: Every day | ORAL | Status: DC
  Administered 2011-06-04 – 2011-06-06 (×2): 325 mg via ORAL
  Filled 2011-06-04 (×3): qty 1

## 2011-06-04 MED ORDER — POTASSIUM CHLORIDE CRYS ER 20 MEQ PO TBCR
40.0000 meq | EXTENDED_RELEASE_TABLET | ORAL | Status: AC
  Administered 2011-06-04 (×2): 40 meq via ORAL
  Filled 2011-06-04 (×2): qty 2

## 2011-06-04 MED ORDER — SODIUM CHLORIDE 0.9 % IV SOLN
INTRAVENOUS | Status: DC
  Administered 2011-06-04: 17:00:00 via INTRAVENOUS
  Administered 2011-06-06: 500 mL via INTRAVENOUS

## 2011-06-04 MED ORDER — ASPIRIN 300 MG RE SUPP
300.0000 mg | Freq: Every day | RECTAL | Status: DC
  Filled 2011-06-04 (×3): qty 1

## 2011-06-04 MED ORDER — ADULT MULTIVITAMIN W/MINERALS CH
1.0000 | ORAL_TABLET | Freq: Every day | ORAL | Status: DC
  Administered 2011-06-04 – 2011-06-06 (×3): 1 via ORAL
  Filled 2011-06-04 (×3): qty 1

## 2011-06-04 MED ORDER — LORAZEPAM 2 MG/ML IJ SOLN
1.0000 mg | Freq: Once | INTRAMUSCULAR | Status: AC
  Administered 2011-06-04: 1 mg via INTRAVENOUS
  Filled 2011-06-04: qty 1

## 2011-06-04 NOTE — Consult Note (Signed)
[facility] STROKE CONSULT NOTE       Chief Complaint: dizziness and stroke found on MRI   HPI:    [name] is an 71 y.o. female who went to sleep last night at 10:30.  She woke at 2:30 in the morning and noted she felt very dizzy.  This continued even when she stood up.  Do to dizzy sensation she was brought to the ED for further workup.  MRI showed a small acute non hemorrhagic infarcts in the left frontal lobe and remote larger infarcts of the right frontal lobe and right cerebellum. At present time she is having no further symptoms other than dizziness when she initially stands up.  LSN: 10:30 last night tPA Given: No: resolution of symmptoms    Past Medical History  Diagnosis Date  . CIN I (cervical intraepithelial neoplasia I)   . Osteoporosis   . Stroke   . Kidney stone   . Depression   . Hemorrhoids   . Brain tumor     Past Surgical History  Procedure Date  . Tubal ligation   . Kidney stone surgery   . Brain biopsy   . Stomach surgery   . Colposcopy     Family History  Problem Relation Age of Onset  . Diabetes Mother   . Diabetes Maternal Grandfather   . Heart disease Paternal Grandfather   . Heart disease Father    Social History:  reports that she has never smoked. She has never used smokeless tobacco. She reports that she does not drink alcohol or use illicit drugs.  Allergies:  Allergies  Allergen Reactions  . Codeine     "wires her up"    Medications:    Prior to Admission:  (Not in a hospital admission) Scheduled:   . sodium chloride   Intravenous STAT  . LORazepam  1 mg Intravenous Once    ROS: History obtained from the patient  General ROS: negative for - chills, fatigue, fever, night sweats, weight gain or weight loss Psychological ROS: negative for - behavioral disorder, hallucinations, memory difficulties, mood swings or suicidal ideation Ophthalmic ROS: negative for - blurry vision, double vision,  eye pain or loss of vision ENT ROS: negative for - epistaxis, nasal discharge, oral lesions, sore throat, tinnitus or vertigo Allergy and Immunology ROS: negative for - hives or itchy/watery eyes Hematological and Lymphatic ROS: negative for - bleeding problems, bruising or swollen lymph nodes Endocrine ROS: negative for - galactorrhea, hair pattern changes, polydipsia/polyuria or temperature intolerance Respiratory ROS: negative for - cough, hemoptysis, shortness of breath or wheezing Cardiovascular ROS: negative for - chest pain, dyspnea on exertion, edema or irregular heartbeat Gastrointestinal ROS: negative for - abdominal pain, diarrhea, hematemesis, nausea/vomiting or stool incontinence Genito-Urinary ROS: negative for - dysuria, hematuria, incontinence or urinary frequency/urgency Musculoskeletal ROS: negative for - joint swelling or muscular weakness Neurological ROS: as noted in HPI Dermatological ROS: negative for rash and skin lesion changes   Physical Examination: Blood pressure 129/77, pulse 70, temperature 97.8 F (36.6 C), temperature source Oral, resp. rate 16, last menstrual period 03/27/1983, SpO2 96.00%.  Neurologic Examination:   Mental Status: Alert, oriented, thought content appropriate.  Speech fluent without evidence of aphasia. Able to follow 3 step commands without difficulty. Cranial Nerves: II-Visual fields grossly intact. III/IV/VI-Extraocular movements intact.  Pupils reactive bilaterally. V/VII-Smile symmetric VIII-grossly intact IX/X-normal gag XI-bilateral shoulder shrug XII-midline tongue extension Motor: 5/5  bilaterally with normal tone and bulk Sensory: Pinprick and light touch intact throughout, bilaterally Deep Tendon Reflexes: 2+ and symmetric throughout Plantars: Downgoing bilaterally Cerebellar: Normal finger-to-nose, normal rapid alternating movements and normal heel-to-shin test.  Normal gait and station after initial dizziness once  standing     Lab Results  Component Value Date/Time   CHOL  Value: 133        ATP III CLASSIFICATION:  <200     mg/dL   Desirable  161-096  mg/dL   Borderline High  >=045    mg/dL   High        07/02/8117 10:35 AM   TSH 3.459 Test methodology is 3rd generation TSH 06/03/2008 10:35 AM   Ct Head Wo Contrast  06/04/2011  *RADIOLOGY REPORT*  Clinical Data: Dizziness  CT HEAD WITHOUT CONTRAST  Technique:  Contiguous axial images were obtained from the base of the skull through the vertex without contrast.  Comparison: 06/03/2008 MRI, 06/02/2008 CT  Findings: Encephalomalacia involving the right frontal parietal lobe. Medial right cerebellum remote infarction, similar to prior.  Remote right basal ganglia lacunar infarction versus prominent perivascular space. Prominence of the sulci, cisterns, and ventricles, in keeping with volume loss. There are subcortical and periventricular white matter hypodensities, a nonspecific finding most often seen with chronic microangiopathic changes.  There is no evidence for acute hemorrhage, overt hydrocephalus, mass lesion, or abnormal extra-axial fluid collection.  No definite CT evidence for acute cortical based (large artery) infarction. Right maxillary sinus mucosal thickening.  Possible postsurgical change.  Otherwise, the visualized paranasal sinuses and mastoid air cells are predominately clear.  Status post right frontoparietal craniotomy.  IMPRESSION: Right cerebellar infarction, encephalomalacia underlying the right frontal parietal craniotomy, and white matter hypodensities, without significant interval change.  No definite acute intracranial abnormality.  Original Report Authenticated By: Waneta Martins, M.D.   Mr Brain Wo Contrast  06/04/2011  *RADIOLOGY REPORT*  Clinical Data: The awoke at 02:00 a.m. with dizziness.  MRI HEAD WITHOUT CONTRAST  Technique:  Multiplanar, multiecho pulse sequences of the brain and surrounding structures were obtained according  to standard protocol without intravenous contrast.  Comparison: CT head without contrast 06/04/2011.  MRI brain 06/03/2008.  Findings: The diffusion weighted images demonstrate three focal areas of acute infarction within the left frontal lobe.  The largest measures 11 mm.  There is no associated hemorrhage.  Remote infarcts of the right cerebellum and right frontal lobe are noted.  Mild periventricular and subcortical white matter disease is evident otherwise.  Flow is present in the major intracranial arteries.  The globes and orbits are intact.  A fluid level is present in the right maxillary sinus.  Circumferential mucosal thickening is noted as well.  The paranasal sinuses and mastoid air cells are otherwise clear.  IMPRESSION:  1.  Small acute non hemorrhagic infarcts in the left frontal lobe. 2.  Remote larger infarcts of the right frontal lobe and right cerebellum. 3.  Right maxillary sinus disease.  These results were called by telephone on 06/04/2011  at  09:23 a.m. to  Dr. Juleen China, who verbally acknowledged these results.  Original Report Authenticated By: Jamesetta Orleans. MATTERN, M.D.    Assessment:    71 y.o. female with acute non hemorrhagic infarcts in the left frontal lobe.   Stroke Risk Factors - the patient  Plan: 1. HgbA1c, fasting lipid panel 2. MRA  of the brain without contrast 3. PT consult, OT consult, Speech consult 4. Echocardiogram 5. Carotid dopplers 6. Prophylactic  therapy-Antiplatelet med: Plavix - dose 75 mg Daily 7. Risk factor modification    Felicie Morn PA-C [facility] [phone]  06/04/2011, 11:49 AM

## 2011-06-04 NOTE — Progress Notes (Signed)
PT Cancellation Note  Evaluation held until 06/05/11 as pt just started supper and has lots of visitors. Will attempt again in AM.   Thanks!  Audiel Scheiber (Beverely Pace) Carleene Mains PT, DPT Acute Rehabilitation [phone]

## 2011-06-04 NOTE — ED Notes (Signed)
Pt is currently in MRI

## 2011-06-04 NOTE — H&P (Signed)
PCP:  Ancil Boozer, MD, MD   DOA:  06/04/2011  4:42 AM  Chief Complaint:  Dizziness  HPI: 71 years old Caucasian woman with history of stroke and benign brain tumor, she was brought into the ER today with chief complaint of spell of dizziness around 2 AM, associated with left-sided numbness and she's reported that her vision of her left eye appeared more like a kaleidoscope .She denies any muscle weakness, headache, imbalanced gait, chest pain or shortness of breath. In the ER, MRI brain showed acute left frontal nonhemorrhagic infarct, neurology was consulted and we were consulted for admission.  Allergies: Allergies  Allergen Reactions  . Codeine     "wires her up"    Prior to Admission medications   Medication Sig Start Date End Date Taking? Authorizing Provider  aspirin 325 MG tablet Take 325 mg by mouth daily as needed. For pain   Yes Historical Provider, MD  aspirin 81 MG tablet Take 81 mg by mouth daily.     Yes Historical Provider, MD  Calcium Carbonate-Vitamin D (CALCIUM + D PO) Take by mouth 2 (two) times daily.     Yes Historical Provider, MD  [facility]) Place 1 application vaginally every Monday, Wednesday, and Friday.   Yes Historical Provider, MD  Multiple Vitamin (MULITIVITAMIN WITH MINERALS) TABS Take 1 tablet by mouth daily.   Yes Historical Provider, MD  diazepam (VALIUM) 5 MG tablet Take 1 tablet (5 mg total) by mouth every 8 (eight) hours as needed (dizziness). 06/04/11 06/14/11  Lyanne Co, MD    Past Medical History  Diagnosis Date  . CIN I (cervical intraepithelial neoplasia I)   . Osteoporosis   . Stroke   . Kidney stone   . Depression   . Hemorrhoids   . Brain tumor     Past Surgical History  Procedure Date  . Tubal ligation   . Kidney stone surgery   . Brain biopsy   . Stomach surgery   . Colposcopy     Social History: Lives with her husband, denies smoking, EtOH or illicit drug use  Family History  Problem Relation Age of  Onset  . Diabetes Mother   . Diabetes Maternal Grandfather   . Heart disease Paternal Grandfather   .  stroke  Father     Review of Systems:  Constitutional: Complaining of chills on and off for more than one year .Denies fever,  diaphoresis, appetite change and fatigue.  HEENT: Denies photophobia, eye pain, redness, hearing loss, ear pain, congestion, sore throat, rhinorrhea, sneezing, mouth sores, trouble swallowing, neck pain, neck stiffness and tinnitus.   Respiratory: Denies SOB, DOE, cough, chest tightness,  and wheezing.   Cardiovascular: Denies chest pain, palpitations and leg swelling.  Gastrointestinal: Denies nausea, vomiting, abdominal pain, diarrhea, constipation, blood in stool and abdominal distention.  Genitourinary: Denies dysuria, urgency, frequency, hematuria, flank pain and difficulty urinating.  Musculoskeletal: Denies myalgias, back pain, joint swelling, arthralgias and gait problem.  Skin: Denies pallor, rash and wound.  Neurological: c/o dizziness and left-sided numbness .Denies  seizures, syncope, weakness, light-headedness and headaches.     Physical Exam:  Filed Vitals:   06/04/11 0952 06/04/11 0957 06/04/11 1000 06/04/11 1100  BP: 117/54  122/67 129/77  Pulse: 68  71 70  Temp:  97.8 F (36.6 C)    TempSrc:      Resp: 16  16   SpO2: 99%  98% 96%    Constitutional: Vital signs reviewed.  Bruce is a  well-developed and well-nourished in no acute distress and cooperative with exam. Alert and oriented x3.  Eyes: PERRL, EOMI, conjunctivae normal, No scleral icterus.  Neck: Supple, Trachea midline normal ROM, No JVD, mass, thyromegaly, or carotid bruit present.  Cardiovascular: RRR, S1 normal, S2 normal, no MRG, pulses symmetric and intact bilaterally Pulmonary/Chest: CTAB, no wheezes, rales, or rhonchi Abdominal: Soft. Non-tender, non-distended, bowel sounds are normal, no masses, organomegaly, or guarding present.  Ext: no edema and no cyanosis, pulses  palpable bilaterally (DP and PT) Neurological: A&O x3, cranial nerve II-XII are grossly intact,  more power 5/5 in the upper extremities  and right lower extremity, 4+/5 in the left lower extremity  . Sensation slightly diminished on the left side. .  Labs on Admission:  Results for orders placed during the hospital encounter of 06/04/11 (from the past 48 hour(s))  CBC     Status: Normal   Collection Time   06/04/11  5:19 AM      Component Value Range Comment   WBC 6.6  4.0 - 10.5 (K/uL)    RBC 3.87  3.87 - 5.11 (MIL/uL)    Hemoglobin 12.1  12.0 - 15.0 (g/dL)    HCT 21.3  08.6 - 57.8 (%)    MCV 93.0  78.0 - 100.0 (fL)    MCH 31.3  26.0 - 34.0 (pg)    MCHC 33.6  30.0 - 36.0 (g/dL)    RDW 46.9  62.9 - 52.8 (%)    Platelets 158  150 - 400 (K/uL)   BASIC METABOLIC PANEL     Status: Abnormal   Collection Time   06/04/11  5:19 AM      Component Value Range Comment   Sodium 139  135 - 145 (mEq/L)    Potassium 3.3 (*) 3.5 - 5.1 (mEq/L)    Chloride 104  96 - 112 (mEq/L)    CO2 26  19 - 32 (mEq/L)    Glucose, Bld 77  70 - 99 (mg/dL)    BUN 14  6 - 23 (mg/dL)    Creatinine, Ser 4.13  0.50 - 1.10 (mg/dL)    Calcium 9.9  8.4 - 10.5 (mg/dL)    GFR calc non Af Amer 67 (*) >90 (mL/min)    GFR calc Af Amer 77 (*) >90 (mL/min)     Radiological Exams on Admission: Ct Head Wo Contrast  06/04/2011  *RADIOLOGY REPORT*  Clinical Data: Dizziness  CT HEAD WITHOUT CONTRAST  Technique:  Contiguous axial images were obtained from the base of the skull through the vertex without contrast.  Comparison: 06/03/2008 MRI, 06/02/2008 CT  Findings: Encephalomalacia involving the right frontal parietal lobe. Medial right cerebellum remote infarction, similar to prior.  Remote right basal ganglia lacunar infarction versus prominent perivascular space. Prominence of the sulci, cisterns, and ventricles, in keeping with volume loss. There are subcortical and periventricular white matter hypodensities, a nonspecific finding  most often seen with chronic microangiopathic changes.  There is no evidence for acute hemorrhage, overt hydrocephalus, mass lesion, or abnormal extra-axial fluid collection.  No definite CT evidence for acute cortical based (large artery) infarction. Right maxillary sinus mucosal thickening.  Possible postsurgical change.  Otherwise, the visualized paranasal sinuses and mastoid air cells are predominately clear.  Status post right frontoparietal craniotomy.  IMPRESSION: Right cerebellar infarction, encephalomalacia underlying the right frontal parietal craniotomy, and white matter hypodensities, without significant interval change.  No definite acute intracranial abnormality.  Original Report Authenticated By: Waneta Martins, M.D.  Mr Brain Wo Contrast  06/04/2011  *RADIOLOGY REPORT*  Clinical Data: The awoke at 02:00 a.m. with dizziness.  MRI HEAD WITHOUT CONTRAST  Technique:  Multiplanar, multiecho pulse sequences of the brain and surrounding structures were obtained according to standard protocol without intravenous contrast.  Comparison: CT head without contrast 06/04/2011.  MRI brain 06/03/2008.  Findings: The diffusion weighted images demonstrate three focal areas of acute infarction within the left frontal lobe.  The largest measures 11 mm.  There is no associated hemorrhage.  Remote infarcts of the right cerebellum and right frontal lobe are noted.  Mild periventricular and subcortical white matter disease is evident otherwise.  Flow is present in the major intracranial arteries.  The globes and orbits are intact.  A fluid level is present in the right maxillary sinus.  Circumferential mucosal thickening is noted as well.  The paranasal sinuses and mastoid air cells are otherwise clear.  IMPRESSION:  1.  Small acute non hemorrhagic infarcts in the left frontal lobe. 2.  Remote larger infarcts of the right frontal lobe and right cerebellum. 3.  Right maxillary sinus disease.  These results were  called by telephone on 06/04/2011  at  09:23 a.m. to  Dr. Juleen China, who verbally acknowledged these results.  Original Report Authenticated By: Jamesetta Orleans. MATTERN, M.D.    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 Plan: Admit to neurology floor Check MRA head, carotid Doppler and 2-D echocardiogram. Continue aspirin. Check hemoglobin A1c, lipid panel and TSH.  PT/OT consult. Bruce already passed swallow screen Neurology  service was consulted and will follow recommendations. Other status discussed with Bruce and she requested DO NOT INTUBATE.   Time Spent on Admission: Approximately 50 minutes   Anastassia Noack 06/04/2011, 11:16 AM

## 2011-06-04 NOTE — Progress Notes (Signed)
Utilization Review Completed.Eddy Termine T3/01/2012   

## 2011-06-04 NOTE — ED Provider Notes (Signed)
Patient care was assumed from Dr. Patria Mane in sign out with MRI pending. MRI does show what appears to be several small acute non-hemorrhagic infarcts in the left frontal lobe. Discussed with neurology as well as hospitalist for admission.   Ct Head Wo Contrast  06/04/2011  *RADIOLOGY REPORT*  Clinical Data: Dizziness  CT HEAD WITHOUT CONTRAST  Technique:  Contiguous axial images were obtained from the base of the skull through the vertex without contrast.  Comparison: 06/03/2008 MRI, 06/02/2008 CT  Findings: Encephalomalacia involving the right frontal parietal lobe. Medial right cerebellum remote infarction, similar to prior.  Remote right basal ganglia lacunar infarction versus prominent perivascular space. Prominence of the sulci, cisterns, and ventricles, in keeping with volume loss. There are subcortical and periventricular white matter hypodensities, a nonspecific finding most often seen with chronic microangiopathic changes.  There is no evidence for acute hemorrhage, overt hydrocephalus, mass lesion, or abnormal extra-axial fluid collection.  No definite CT evidence for acute cortical based (large artery) infarction. Right maxillary sinus mucosal thickening.  Possible postsurgical change.  Otherwise, the visualized paranasal sinuses and mastoid air cells are predominately clear.  Status post right frontoparietal craniotomy.  IMPRESSION: Right cerebellar infarction, encephalomalacia underlying the right frontal parietal craniotomy, and white matter hypodensities, without significant interval change.  No definite acute intracranial abnormality.  Original Report Authenticated By: Waneta Martins, M.D.   Mr Brain Wo Contrast  06/04/2011  *RADIOLOGY REPORT*  Clinical Data: The awoke at 02:00 a.m. with dizziness.  MRI HEAD WITHOUT CONTRAST  Technique:  Multiplanar, multiecho pulse sequences of the brain and surrounding structures were obtained according to standard protocol without intravenous contrast.   Comparison: CT head without contrast 06/04/2011.  MRI brain 06/03/2008.  Findings: The diffusion weighted images demonstrate three focal areas of acute infarction within the left frontal lobe.  The largest measures 11 mm.  There is no associated hemorrhage.  Remote infarcts of the right cerebellum and right frontal lobe are noted.  Mild periventricular and subcortical white matter disease is evident otherwise.  Flow is present in the major intracranial arteries.  The globes and orbits are intact.  A fluid level is present in the right maxillary sinus.  Circumferential mucosal thickening is noted as well.  The paranasal sinuses and mastoid air cells are otherwise clear.  IMPRESSION:  1.  Small acute non hemorrhagic infarcts in the left frontal lobe. 2.  Remote larger infarcts of the right frontal lobe and right cerebellum. 3.  Right maxillary sinus disease.  These results were called by telephone on 06/04/2011  at  09:23 a.m. to  Dr. Juleen China, who verbally acknowledged these results.  Original Report Authenticated By: Jamesetta Orleans. MATTERN, M.D.    Raeford Razor, MD 06/04/11 1015

## 2011-06-04 NOTE — ED Provider Notes (Addendum)
History     CSN: [id number]  Arrival date & time 06/04/11  1478   First MD Initiated Contact with Patient 06/04/11 [phone]      Chief Complaint  Patient presents with  . Dizziness     The history is provided by the patient.  reports "dizziness" since 2am.  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 Micah Flesher to bed at 10pm and was normal.  She denies ataxia.  She denies weakness of her arms or legs.  She denies difficulty with her speech.  She denies chest pain or shortness of breath.  She reports that the vision out of her left eye appears more like a kaleidoscope.  There is no associated nausea or vomiting.  She denies eye pain.  Past Medical History  Diagnosis Date  . CIN I (cervical intraepithelial neoplasia I)   . Osteoporosis   . Stroke   . Kidney stone   . Depression   . Hemorrhoids   . Brain tumor     Past Surgical History  Procedure Date  . Tubal ligation   . Kidney stone surgery   . Brain biopsy   . Stomach surgery   . Colposcopy     Family History  Problem Relation Age of Onset  . Diabetes Mother   . Diabetes Maternal Grandfather   . Heart disease Paternal Grandfather   . Heart disease Father     History  Substance Use Topics  . Smoking status: Never Smoker   . Smokeless tobacco: Never Used  . Alcohol Use: No    OB History    Grav Para Term Preterm Abortions TAB SAB Ect Mult Living   4 3 3  1  1   3       Review of Systems  All other systems reviewed and are negative.    Allergies  Codeine  Home Medications   Current Outpatient Rx  Name Route Sig Dispense Refill  . ASPIRIN 325 MG PO TABS Oral Take 325 mg by mouth daily as needed. For pain    . ASPIRIN 81 MG PO TABS Oral Take 81 mg by mouth daily.      Marland Kitchen CALCIUM + D PO Oral Take by mouth 2 (two) times daily.      Marland Kitchen VAGIFEM VA Vaginal Place 1 application vaginally every Monday, Wednesday, and Friday.    . ADULT  MULTIVITAMIN W/MINERALS CH Oral Take 1 tablet by mouth daily.      BP 128/57  Pulse 59  Temp(Src) 97.9 F (36.6 C) (Oral)  Resp 9  SpO2 96%  LMP 03/27/1983  Physical Exam  Nursing note and vitals reviewed. Constitutional: She is oriented to person, place, and time. She appears well-developed and well-nourished.  HENT:  Head: Normocephalic and atraumatic.  Eyes: Pupils are equal, round, and reactive to light.  Cardiovascular: Regular rhythm.   Pulmonary/Chest: Effort normal.  Abdominal: Soft. There is no tenderness.  Musculoskeletal: Normal range of motion.  Neurological: She is alert and oriented to person, place, and time.       5/5 strength in major muscle groups of  bilateral upper and lower extremities. Speech normal. No facial asymetry.   Skin: Skin is warm and dry.  Psychiatric: She has a normal mood and affect.    ED Course  Procedures (including critical care time)   Date: 06/04/2011  Rate: 61  Rhythm: normal sinus rhythm  QRS Axis: normal  Intervals: normal  ST/T Wave abnormalities: normal  Conduction Disutrbances: none  Narrative Interpretation:   Old EKG Reviewed: No significant changes noted    Labs Reviewed  BASIC METABOLIC PANEL - Abnormal; Notable for the following:    Potassium 3.3 (*)    GFR calc non Af Amer 67 (*)    GFR calc Af Amer 77 (*)    All other components within normal limits  CBC   Ct Head Wo Contrast  06/04/2011  *RADIOLOGY REPORT*  Clinical Data: Dizziness  CT HEAD WITHOUT CONTRAST  Technique:  Contiguous axial images were obtained from the base of the skull through the vertex without contrast.  Comparison: 06/03/2008 MRI, 06/02/2008 CT  Findings: Encephalomalacia involving the right frontal parietal lobe. Medial right cerebellum remote infarction, similar to prior.  Remote right basal ganglia lacunar infarction versus prominent perivascular space. Prominence of the sulci, cisterns, and ventricles, in keeping with volume loss. There are  subcortical and periventricular white matter hypodensities, a nonspecific finding most often seen with chronic microangiopathic changes.  There is no evidence for acute hemorrhage, overt hydrocephalus, mass lesion, or abnormal extra-axial fluid collection.  No definite CT evidence for acute cortical based (large artery) infarction. Right maxillary sinus mucosal thickening.  Possible postsurgical change.  Otherwise, the visualized paranasal sinuses and mastoid air cells are predominately clear.  Status post right frontoparietal craniotomy.  IMPRESSION: Right cerebellar infarction, encephalomalacia underlying the right frontal parietal craniotomy, and white matter hypodensities, without significant interval change.  No definite acute intracranial abnormality.  Original Report Authenticated By: Waneta Martins, M.D.   i personally reviewed the CT scan  No diagnosis found.    MDM  My suspicion this is stroke is very low. She is outside the window for TPA. Will obtain MRI to evaluate further  9:11 AM Care to Dr Denyse Dago, MD 06/04/11 1610  Lyanne Co, MD 06/04/11 [phone]

## 2011-06-04 NOTE — Progress Notes (Signed)
VASCULAR LAB PRELIMINARY  PRELIMINARY  PRELIMINARY  PRELIMINARY  Carotid Dopplers completed.    Preliminary report:  There is no ICA stenosis.  Vertebral artery flow is antegrade.  [name], 06/04/2011, 2:40 PM

## 2011-06-04 NOTE — ED Notes (Signed)
PT. WOKE UP THIS MORNING 2AM WITH DIZZINESS , TOOK 2 REGULAR ASA PRIOR TO ARRIVAL , PT. CONCERNED ABOUT  STROKE" , SPEECH CLEAR , NO FACIAL DROOP , EQUAL STRONG GRIPS , AMBULATORY. NO ARM DRIFT.

## 2011-06-05 DIAGNOSIS — Z87898 Personal history of other specified conditions: Secondary | ICD-10-CM

## 2011-06-05 DIAGNOSIS — Z8673 Personal history of transient ischemic attack (TIA), and cerebral infarction without residual deficits: Secondary | ICD-10-CM

## 2011-06-05 LAB — LIPID PANEL
Cholesterol: 135 mg/dL (ref 0–200)
Total CHOL/HDL Ratio: 3.5 RATIO
Triglycerides: 109 mg/dL (ref <150)
VLDL: 22 mg/dL (ref 0–40)

## 2011-06-05 LAB — HEMOGLOBIN A1C: Hgb A1c MFr Bld: 6 % — ABNORMAL HIGH (ref <5.7)

## 2011-06-05 LAB — BASIC METABOLIC PANEL
BUN: 10 mg/dL (ref 6–23)
Calcium: 8.1 mg/dL — ABNORMAL LOW (ref 8.4–10.5)
GFR calc non Af Amer: 83 mL/min — ABNORMAL LOW (ref >90)
Glucose, Bld: 84 mg/dL (ref 70–99)

## 2011-06-05 MED ORDER — MENTHOL 3 MG MT LOZG
1.0000 | LOZENGE | OROMUCOSAL | Status: DC | PRN
  Administered 2011-06-05: 3 mg via ORAL
  Filled 2011-06-05: qty 9

## 2011-06-05 MED ORDER — ZOLPIDEM TARTRATE 5 MG PO TABS
5.0000 mg | ORAL_TABLET | Freq: Every evening | ORAL | Status: DC | PRN
  Administered 2011-06-05: 5 mg via ORAL
  Filled 2011-06-05: qty 1

## 2011-06-05 NOTE — Progress Notes (Signed)
Subjective: [name] is an 71 y.o. female who went to sleep last night at 10:30. She woke at 2:30 in the morning and noted she felt very dizzy. This continued even when she stood up. Do to dizzy sensation she was brought to the ED for further workup. MRI showed a small acute non hemorrhagic infarcts in the left frontal lobe and remote larger infarcts of the right frontal lobe and right cerebellum. At present time she is having no further symptoms other than dizziness when she initially stands up. She is doing well this am without any new complaints  Objective: Vital signs in last 24 hours: Temp:  [97.4 F (36.3 C)-98.7 F (37.1 C)] 97.4 F (36.3 C) (03/12 0931) Pulse Rate:  [68-86] 68  (03/12 0931) Resp:  [13-19] 18  (03/12 0931) BP: (95-143)/(56-83) 128/72 mmHg (03/12 0931) SpO2:  [95 %-98 %] 98 % (03/12 0931) Weight:  [55.339 kg (122 lb)] 55.339 kg (122 lb) (03/11 2200) Weight change:  Last BM Date: 06/03/11  Intake/Output from previous day: 03/11 0701 - 03/12 0700 In: 1200 [I.V.:1200] Out: -  Intake/Output this shift: Total I/O In: 360 [P.O.:360] Out: -   Physical exam pleasant middle-aged Caucasian lady currently not in distress. Afebrile. Head is non-traumatic. Neck is supple without bruit. Hearing is normal. Cardiac exam no murmur or gallop. Lungs clear to auscultation. Abdomen soft nontender. Neurological exam Awake  Alert oriented x 3. Normal speech and language.eye movements full without nystagmus. Face symmetric. Tongue midline. Normal strength, tone, reflexes and coordination. Normal sensation. Gait deferred.   Lab Results:  Basename 06/04/11 1557 06/04/11 0519  WBC 5.7 6.6  HGB 12.0 12.1  HCT 36.4 36.0  PLT 157 158   BMET  Basename 06/05/11 0605 06/04/11 1557 06/04/11 0519  NA 141 -- 139  K 4.1 -- 3.3*  CL 111 -- 104  CO2 25 -- 26  GLUCOSE 84 -- 77  BUN 10 -- 14  CREATININE 0.73 0.74 --  CALCIUM 8.1* -- 9.9    Studies/Results: Dg Chest 2  View  06/04/2011  *RADIOLOGY REPORT*  Clinical Data: Stroke.  CHEST - 2 VIEW  Comparison: 06/02/2008 and chest CTA dated 06/03/2008.  Findings: Normal sized heart.  Clear lungs.  Stable right diaphragmatic eventration.  The lungs remain hyperexpanded with mildly prominent interstitial markings and mild central peribronchial thickening.  Stable right breast implant capsular calcifications.  Mild to moderate scoliosis.  IMPRESSION:  1.  No acute abnormality. 2.  Stable changes of COPD and chronic bronchitis.  Original Report Authenticated By: STEVEN H. REID, M.D.   Ct Head Wo Contrast  06/04/2011  *RADIOLOGY REPORT*  Clinical Data: Dizziness  CT HEAD WITHOUT CONTRAST  Technique:  Contiguous axial images were obtained from the base of the skull through the vertex without contrast.  Comparison: 06/03/2008 MRI, 06/02/2008 CT  Findings: Encephalomalacia involving the right frontal parietal lobe. Medial right cerebellum remote infarction, similar to prior.  Remote right basal ganglia lacunar infarction versus prominent perivascular space. Prominence of the sulci, cisterns, and ventricles, in keeping with volume loss. There are subcortical and periventricular white matter hypodensities, a nonspecific finding most often seen with chronic microangiopathic changes.  There is no evidence for acute hemorrhage, overt hydrocephalus, mass lesion, or abnormal extra-axial fluid collection.  No definite CT evidence for acute cortical based (large artery) infarction. Right maxillary sinus mucosal thickening.  Possible postsurgical change.  Otherwise, the visualized paranasal sinuses and mastoid air cells are predominately clear.  Status post right frontoparietal   craniotomy.  IMPRESSION: Right cerebellar infarction, encephalomalacia underlying the right frontal parietal craniotomy, and white matter hypodensities, without significant interval change.  No definite acute intracranial abnormality.  Original Report Authenticated By:  ANDREW J. DELGAIZO, M.D.   Mr Mra Head Wo Contrast  06/04/2011  *RADIOLOGY REPORT*  Clinical Data: Left frontal lobe infarcts.  MRA HEAD WITHOUT CONTRAST  Technique: Angiographic images of the Circle of Willis were obtained using MRA technique without intravenous contrast.  Comparison: MRI brain 06/04/2011.  MRA circle of Willis 06/03/2008.  Findings: The distal internal carotid arteries are tortuous bilaterally.  There is no focal stenosis.  The A1 and M1 segments are within normal limits.  A tiny anterior communicating artery is present.  The MCA bifurcations are within normal limits.  Mild small vessel disease is evident.  There is some attenuation of distal MCA branch vessels bilaterally.  No significant proximal stenosis or occlusion is evident.  The right vertebral artery slightly dominant left.  The PICA origins are visualized and within normal limits bilaterally.  The basilar artery is tortuous.  The right posterior cerebral artery is of fetal type.  There is mild irregularity of the proximal left P2 segment.  PCA branch vessel segmental attenuation is noted bilaterally.  IMPRESSION:  1.  No significant proximal stenosis, aneurysm, or branch vessel occlusion. 2.  Tortuous internal carotid arteries bilaterally. 3.  Mild to moderate small vessel disease.  Original Report Authenticated By: CHRISTOPHER W. MATTERN, M.D.   Mr Brain Wo Contrast  06/04/2011  *RADIOLOGY REPORT*  Clinical Data: The awoke at 02:00 a.m. with dizziness.  MRI HEAD WITHOUT CONTRAST  Technique:  Multiplanar, multiecho pulse sequences of the brain and surrounding structures were obtained according to standard protocol without intravenous contrast.  Comparison: CT head without contrast 06/04/2011.  MRI brain 06/03/2008.  Findings: The diffusion weighted images demonstrate three focal areas of acute infarction within the left frontal lobe.  The largest measures 11 mm.  There is no associated hemorrhage.  Remote infarcts of the right  cerebellum and right frontal lobe are noted.  Mild periventricular and subcortical white matter disease is evident otherwise.  Flow is present in the major intracranial arteries.  The globes and orbits are intact.  A fluid level is present in the right maxillary sinus.  Circumferential mucosal thickening is noted as well.  The paranasal sinuses and mastoid air cells are otherwise clear.  IMPRESSION:  1.  Small acute non hemorrhagic infarcts in the left frontal lobe. 2.  Remote larger infarcts of the right frontal lobe and right cerebellum. 3.  Right maxillary sinus disease.  These results were called by telephone on 06/04/2011  at  09:23 a.m. to  Dr. Kohut, who verbally acknowledged these results.  Original Report Authenticated By: CHRISTOPHER W. MATTERN, M.D.      Carotid Dopplers preliminary report no significant extracranial stenosis     Medications: I have reviewed the patient's current medications.  Assessment/Plan: 70 year lady with embolic left frontal small infarcts. Previous history of right brain stroke and a silent stroke Plan : check TEE. Continue Plavix. Check echocardiogram lipid profile and hemoglobin A1c. Discussed with patient and husband.  LOS: 1 day   SETHI,PRAMODKUMAR P 06/05/2011, 12:14 PM     

## 2011-06-05 NOTE — Progress Notes (Signed)
PT Discharge Note  Pt at baseline level of functioning, excellent balance. No acute or f/u PT needs found. PT signing off, please reorder if needed.  Maeryn Mcgath (Beverely Pace) Carleene Mains PT, DPT Acute Rehabilitation [phone]

## 2011-06-05 NOTE — Progress Notes (Signed)
Subjective: Patient seen and examined, denies any complaints.  Objective: Vital signs in last 24 hours: Temp:  [97.4 F (36.3 C)-98.7 F (37.1 C)] 97.4 F (36.3 C) (03/12 0931) Pulse Rate:  [68-86] 68  (03/12 0931) Resp:  [13-19] 18  (03/12 0931) BP: (95-143)/(56-83) 128/72 mmHg (03/12 0931) SpO2:  [95 %-98 %] 98 % (03/12 0931) Weight:  [55.339 kg (122 lb)] 55.339 kg (122 lb) (03/11 2200) Weight change:  Last BM Date: 06/03/11  Intake/Output from previous day: 03/11 0701 - 03/12 0700 In: 1200 [I.V.:1200] Out: -  Total I/O In: 360 [P.O.:360] Out: -    Physical Exam: General: Alert, awake, oriented x3, in no acute distress. HEENT: No bruits, no goiter. Heart: Regular rate and rhythm, without murmurs, rubs, gallops. Lungs: Clear to auscultation bilaterally. Abdomen: Soft, nontender, nondistended, positive bowel sounds. Extremities: No clubbing cyanosis or edema with positive pedal pulses. Neuro: Grossly intact, nonfocal.    Lab Results: Results for orders placed during the hospital encounter of 06/04/11 (from the past 24 hour(s))  HEMOGLOBIN A1C     Status: Abnormal   Collection Time   06/04/11 12:05 PM      Component Value Range   Hemoglobin A1C 6.1 (*) <5.7 (%)   Mean Plasma Glucose 128 (*) <117 (mg/dL)  CBC     Status: Normal   Collection Time   06/04/11  3:57 PM      Component Value Range   WBC 5.7  4.0 - 10.5 (K/uL)   RBC 3.92  3.87 - 5.11 (MIL/uL)   Hemoglobin 12.0  12.0 - 15.0 (g/dL)   HCT 81.1  91.4 - 78.2 (%)   MCV 92.9  78.0 - 100.0 (fL)   MCH 30.6  26.0 - 34.0 (pg)   MCHC 33.0  30.0 - 36.0 (g/dL)   RDW 95.6  21.3 - 08.6 (%)   Platelets 157  150 - 400 (K/uL)  CREATININE, SERUM     Status: Abnormal   Collection Time   06/04/11  3:57 PM      Component Value Range   Creatinine, Ser 0.74  0.50 - 1.10 (mg/dL)   GFR calc non Af Amer 83 (*) >90 (mL/min)   GFR calc Af Amer >90  >90 (mL/min)  BASIC METABOLIC PANEL     Status: Abnormal   Collection Time   06/05/11  6:05 AM      Component Value Range   Sodium 141  135 - 145 (mEq/L)   Potassium 4.1  3.5 - 5.1 (mEq/L)   Chloride 111  96 - 112 (mEq/L)   CO2 25  19 - 32 (mEq/L)   Glucose, Bld 84  70 - 99 (mg/dL)   BUN 10  6 - 23 (mg/dL)   Creatinine, Ser 5.78  0.50 - 1.10 (mg/dL)   Calcium 8.1 (*) 8.4 - 10.5 (mg/dL)   GFR calc non Af Amer 83 (*) >90 (mL/min)   GFR calc Af Amer >90  >90 (mL/min)  LIPID PANEL     Status: Abnormal   Collection Time   06/05/11  6:05 AM      Component Value Range   Cholesterol 135  0 - 200 (mg/dL)   Triglycerides 469  <629 (mg/dL)   HDL 39 (*) >52 (mg/dL)   Total CHOL/HDL Ratio 3.5     VLDL 22  0 - 40 (mg/dL)   LDL Cholesterol 74  0 - 99 (mg/dL)    Studies/Results:    Medications:    . sodium chloride  Intravenous STAT  . aspirin  300 mg Rectal Daily   Or  . aspirin  325 mg Oral Daily  . clopidogrel  75 mg Oral Q breakfast  . heparin  5,000 Units Subcutaneous Q8H  . mulitivitamin with minerals  1 tablet Oral Daily  . potassium chloride  40 mEq Oral Q4H  . zolpidem  5 mg Oral Once    ondansetron (ZOFRAN) IV, ondansetron (ZOFRAN) IV, senna-docusate     . sodium chloride 100 mL/hr at 06/05/11 0700    Assessment/Plan:  Active Problems:  Acute Stroke  History of previous stroke  History of Brain tumor, reported to be benign.  Plan:  And MRI brain showed acute left frontal infarct and remote cerebellar infarct.MRA head showed no significant findings , carotid Doppler showed no significant stenosis  ,2-D echocardiogram pending .  she was seen by PT no followup recommended.   appreciate neurology input, continue Plavix as per ecommendations  . Neurology recommending TEE,  Will consult Dr Jacinto Halim  ,will keep NPO tonight. Good lipid panel,HBA1C 6.1% ,f/u TSH . CODE status:  DO NOT INTUBATE.    LOS: 1 day   [name] 06/05/2011, 9:54 AM

## 2011-06-05 NOTE — Evaluation (Signed)
Occupational Therapy Evaluation Patient Details Name: [name] MRN: [id number] DOB: 18-Feb-1941 Today's Date: 06/05/2011  Problem List:  Patient Active Problem List  Diagnoses  . CIN I (cervical intraepithelial neoplasia I)  . Osteoporosis  . Stroke  . Kidney stone  . History of stroke  . History of brain tumor    Past Medical History:  Past Medical History  Diagnosis Date  . CIN I (cervical intraepithelial neoplasia I)   . Osteoporosis   . Stroke   . Kidney stone   . Depression   . Hemorrhoids   . Brain tumor   . Hypertension   . Blood transfusion     " no reaction to transfusion "  . GERD (gastroesophageal reflux disease)   . Arthritis   . Fibromyalgia    Past Surgical History:  Past Surgical History  Procedure Date  . Tubal ligation   . Kidney stone surgery   . Brain biopsy   . Stomach surgery   . Colposcopy   . Breast enhancement surgery     OT Assessment/Plan/Recommendation OT Assessment Clinical Impression Statement: This 71 y.o. female admitted for frontal lobe and cerebellar CVAs.  Pt. appears to be at, or close to baseline.  Pt. did demonstrate decrease speed with saccades to Lt. On re-test, speed improved, but not equal to Rt.   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  Instructed husband and pt. in activities that may be impacted.  They verbalized understanding.  No formal OT recommended - pt. and husband verbalize understanding OT Recommendation/Assessment: Patient does not need any further OT services OT Recommendation Follow Up Recommendations: No OT follow up Equipment Recommended: None recommended by OT OT Goals    OT Evaluation Precautions/Restrictions  Precautions Precautions: Fall Precaution Comments: Slight fall risk Required Braces or Orthoses: No Restrictions Weight Bearing Restrictions: No Prior Functioning Home Living Lives With: Spouse Type of Home: House Home Layout: One level Home Access: Level  entry Bathroom Shower/Tub: Engineer, manufacturing systems: Standard Bathroom Accessibility: Yes How Accessible: Accessible via walker Home Adaptive Equipment: None Prior Function Level of Independence: Independent with basic ADLs;Independent with gait Able to Take Stairs?: Yes Driving: Yes Vocation: Retired Leisure: Hobbies-yes (Comment) Comments: 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 ADL ADL Eating/Feeding: Performed;Independent Where Assessed - Eating/Feeding: Edge of bed Grooming: Simulated;Wash/dry hands;Wash/dry face;Teeth care;Supervision/safety Where Assessed - Grooming: Standing at sink Upper Body Bathing: Simulated;Set up Where Assessed - Upper Body Bathing: Unsupported;Sitting, bed Lower Body Bathing: Simulated;Supervision/safety Where Assessed - Lower Body Bathing: Sit to stand from chair;Sit to stand from bed Upper Body Dressing: Simulated;Set up Where Assessed - Upper Body Dressing: Unsupported;Sitting, chair;Sitting, bed Lower Body Dressing: Simulated;Supervision/safety Where Assessed - Lower Body Dressing: Sit to stand from chair;Sit to stand from bed Toilet Transfer: Simulated;Supervision/safety Toilet Transfer Method: Proofreader: Comfort height toilet Toileting - Clothing Manipulation: Simulated;Supervision/safety Where Assessed - Glass blower/designer Manipulation: Standing Toileting - Hygiene: Simulated;Independent Where Assessed - Toileting Hygiene: Sit on 3-in-1 or toilet Ambulation Related to ADLs: Pt. ambulates with supervision per PT ADL Comments: Upon enterance, pt. reports she has no needs, and feels she is back to normal.  Brief eval completed.  Husband present and feels like pt. is better than PTA.  Discussed with them s/s of cognitive deficits associated with frontal lobe involvement, and they verbalized understanding.   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 able to read very small print independently with no difficulty and with good speed Perception Perception: Within Functional Limits Praxis Praxis: Intact Cognition Cognition Arousal/Alertness: Awake/alert Overall Cognitive Status: Appears within functional limits for tasks assessed Orientation Level: Oriented X4 Cognition - Other Comments: Pt. able to recall events of day, remembers therapist from attempted eval earlier in the day.  No deficits noted Sensation/Coordination Sensation Light Touch: Appears Intact Coordination Gross Motor Movements are Fluid and Coordinated: Yes Fine Motor Movements are Fluid and Coordinated: Yes Extremity Assessment RUE Assessment RUE Assessment: Within Functional Limits LUE Assessment LUE Assessment: Within Functional Limits Mobility  Bed Mobility Bed Mobility: Yes Sit to Supine: 7: Independent Transfers Transfers: Yes Sit to Stand: 6: Modified independent (Device/Increase time) Stand to Sit: 6: Modified independent (Device/Increase time) Exercises   End of Session OT - End of Session Activity Tolerance: Patient tolerated treatment well Patient left: in bed;with call bell in reach;with family/visitor present General Behavior During Session: [facility] for tasks performed Cognition: [facility] for tasks performed   Eldora Napp M 06/05/2011, 6:28 PM

## 2011-06-05 NOTE — Evaluation (Addendum)
Physical Therapy Evaluation Patient Details Name: [name] MRN: [id number] DOB: 03/12/1941 Today's Date: 06/05/2011  Problem List:  Patient Active Problem List  Diagnoses  . CIN I (cervical intraepithelial neoplasia I)  . Osteoporosis  . Stroke  . Kidney stone  . Brain tumor    Past Medical History:  Past Medical History  Diagnosis Date  . CIN I (cervical intraepithelial neoplasia I)   . Osteoporosis   . Stroke   . Kidney stone   . Depression   . Hemorrhoids   . Brain tumor   . Hypertension   . Blood transfusion     " no reaction to transfusion "  . GERD (gastroesophageal reflux disease)   . Arthritis   . Fibromyalgia    Past Surgical History:  Past Surgical History  Procedure Date  . Tubal ligation   . Kidney stone surgery   . Brain biopsy   . Stomach surgery   . Colposcopy   . Breast enhancement surgery     PT Assessment/Plan/Recommendation PT Assessment Clinical Impression Statement: 71 year old patient admitted for dizziness and Lt. sided weakness/numbness. Pt found to have acute non hemorrhagic infarcts in the left frontal lobe and remote larger infarcts of the right frontal lobe and right cerebellum. Pt presents with excellent balance scoring 55/56 on Berg Balance test (minimal risk of falls). Pt does well with visual scanning/head turns during gait with no overt losses of balance. Discussed walking plan, pt agreeable.  PT Recommendation/Assessment: Patent does not need any further PT services No Skilled PT: Patient will have necessary level of assist by caregiver at discharge;Patient at baseline level of functioning;Patient is supervision for all activity/mobility PT Recommendation Follow Up Recommendations: No PT follow up Equipment Recommended: None recommended by PT PT Goals   D/C  PT Evaluation Precautions/Restrictions  Precautions Precautions: Fall Precaution Comments: Slight fall risk Required Braces or Orthoses: No Restrictions Weight  Bearing Restrictions: No Prior Functioning  Home Living Lives With: Spouse Type of Home: House (Town home) Home Layout: One level Home Access: Level entry Bathroom Shower/Tub: Engineer, manufacturing systems: Standard Bathroom Accessibility: Yes How Accessible: Accessible via walker Home Adaptive Equipment: None Prior Function Level of Independence: Independent with basic ADLs;Independent with gait Driving: Yes Vocation: Retired Comments: 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 LE tingling and numbness. Has resolved Extremity Assessment RLE Assessment RLE Assessment: Within Functional Limits LLE Assessment LLE Assessment: Within Functional Limits LLE Strength LLE Overall Strength Comments: Pt reports old Lt. knee injury with ligamentous damage. States that when she stays in bed too much she can tell it gets a little weaker, not due to recent episode Mobility (including Balance) Bed Mobility Bed Mobility: Yes Sit to Supine: 6: Modified independent (Device/Increase time) Transfers Transfers: Yes Sit to Stand: 6: Modified independent (Device/Increase time) Stand to Sit: 6: Modified independent (Device/Increase time) Ambulation/Gait Ambulation/Gait: Yes Ambulation/Gait Assistance: 5: Supervision Ambulation/Gait Assistance Details (indicate cue type and reason): Supervision for safety. No overt losses of balance, good base of support Ambulation Distance (Feet): 300 Feet Assistive device: None Gait Pattern: Step-through pattern;Within Functional Limits Stairs: No  Posture/Postural Control Posture/Postural Control: No significant  limitations Balance Balance Assessed: Yes Berg Balance Test Sit to Stand: Able to stand without using hands and stabilize independently Standing  Unsupported: Able to stand safely 2 minutes Sitting with Back Unsupported but Feet Supported on Floor or Stool: Able to sit safely and securely 2 minutes Stand to Sit: Sits safely with minimal use of hands Transfers: Able to transfer safely, minor use of hands Standing Unsupported with Eyes Closed: Able to stand 10 seconds safely Standing Ubsupported with Feet Together: Able to place feet together independently and stand 1 minute safely From Standing, Reach Forward with Outstretched Arm: Can reach confidently >25 cm (10") From Standing Position, Pick up Object from Floor: Able to pick up shoe safely and easily From Standing Position, Turn to Look Behind Over each Shoulder: Looks behind from both sides and weight shifts well Turn 360 Degrees: Able to turn 360 degrees safely in 4 seconds or less Standing Unsupported, Alternately Place Feet on Step/Stool: Able to stand independently and complete 8 steps >20 seconds Standing Unsupported, One Foot in Front: Able to place foot tandem independently and hold 30 seconds Standing on One Leg: Able to lift leg independently and hold > 10 seconds Total Score: 55  High level balance: Gait with horizontal and vertical head movements, changes in gait speed, and immediate turns.  Modified Rankin = 1  Exercise  Other Exercises Other Exercises: Discussed walking program to be performed with husband. Discussed slow increase in activity, educated on importance of mobility to decrease risk for further stroke. End of Session PT - End of Session Equipment Utilized During Treatment: Gait belt Activity Tolerance: Patient tolerated treatment well Patient left: in bed;with call bell in reach;with family/visitor present Nurse Communication: Mobility status for ambulation General Behavior During Session: [facility] for tasks  performed Cognition: [facility] for tasks performed  Wilhemina Bonito 06/05/2011, 9:12 AM  Sherie Don) Carleene Mains PT, DPT Acute Rehabilitation [phone]

## 2011-06-06 ENCOUNTER — Encounter (HOSPITAL_COMMUNITY)
Admission: EM | Disposition: A | Discharge status: Home / Self Care | Payer: Self-pay | Source: Home / Self Care | Attending: Internal Medicine

## 2011-06-06 ENCOUNTER — Encounter (HOSPITAL_COMMUNITY): Payer: Self-pay

## 2011-06-06 DIAGNOSIS — Q211 Atrial septal defect: Secondary | ICD-10-CM

## 2011-06-06 DIAGNOSIS — Q2112 Patent foramen ovale: Secondary | ICD-10-CM

## 2011-06-06 DIAGNOSIS — I2699 Other pulmonary embolism without acute cor pulmonale: Secondary | ICD-10-CM

## 2011-06-06 HISTORY — PX: TEE WITHOUT CARDIOVERSION: SHX5443

## 2011-06-06 SURGERY — ECHOCARDIOGRAM, TRANSESOPHAGEAL
Anesthesia: Moderate Sedation

## 2011-06-06 MED ORDER — SODIUM CHLORIDE 0.9 % IV SOLN: 250.0000 mL | INTRAVENOUS | Status: DC | PRN

## 2011-06-06 MED ORDER — BUTAMBEN-TETRACAINE-BENZOCAINE 2-2-14 % EX AERO
INHALATION_SPRAY | CUTANEOUS | Status: DC | PRN
  Administered 2011-06-06: 2 via TOPICAL

## 2011-06-06 MED ORDER — SODIUM CHLORIDE 0.9 % IJ SOLN: 3.0000 mL | INTRAMUSCULAR | Status: DC | PRN

## 2011-06-06 MED ORDER — CLOPIDOGREL BISULFATE 75 MG PO TABS: 75.0000 mg | ORAL_TABLET | Freq: Every day | ORAL | Status: DC

## 2011-06-06 MED ORDER — MIDAZOLAM HCL 10 MG/2ML IJ SOLN
INTRAMUSCULAR | Status: AC
  Filled 2011-06-06: qty 2

## 2011-06-06 MED ORDER — SODIUM CHLORIDE 0.9 % IJ SOLN: 3.0000 mL | Freq: Two times a day (BID) | INTRAMUSCULAR | Status: DC

## 2011-06-06 MED ORDER — MIDAZOLAM HCL 10 MG/2ML IJ SOLN: 10.0000 mg | Freq: Once | INTRAMUSCULAR | Status: DC

## 2011-06-06 MED ORDER — FENTANYL CITRATE 0.05 MG/ML IJ SOLN: 250.0000 ug | Freq: Once | INTRAMUSCULAR | Status: DC

## 2011-06-06 MED ORDER — SODIUM CHLORIDE 0.45 % IV SOLN: INTRAVENOUS | Status: DC

## 2011-06-06 MED ORDER — MIDAZOLAM HCL 10 MG/2ML IJ SOLN
INTRAMUSCULAR | Status: DC | PRN
  Administered 2011-06-06 (×2): 2 mg via INTRAVENOUS

## 2011-06-06 MED ORDER — FENTANYL CITRATE 0.05 MG/ML IJ SOLN
INTRAMUSCULAR | Status: DC | PRN
  Administered 2011-06-06 (×2): 25 ug via INTRAVENOUS

## 2011-06-06 MED ORDER — BENZOCAINE 20 % MT SOLN
1.0000 "application " | OROMUCOSAL | Status: DC | PRN
  Filled 2011-06-06: qty 57

## 2011-06-06 MED ORDER — FENTANYL CITRATE 0.05 MG/ML IJ SOLN
INTRAMUSCULAR | Status: AC
  Filled 2011-06-06: qty 2

## 2011-06-06 NOTE — Progress Notes (Signed)
  Echocardiogram 2D Echocardiogram has been performed.  Jorje Guild [facility] 06/06/2011, 8:56 AM

## 2011-06-06 NOTE — CV Procedure (Signed)
Moderate sized PFO with strongly positive double contrast study at rest and with Valsalva. Mild AI. Otherwise normal heart.

## 2011-06-06 NOTE — Discharge Summary (Signed)
DISCHARGE SUMMARY  [name]  MR#: [id number]  DOB:Jul 17, 1940  Date of Admission: 06/04/2011 Date of Discharge: 06/06/2011  Attending Physician:Destina Mantei K  Patient's PCP:ALM,STEPHANIE, MD, MD  Consults:Treatment Team:  Md Stroke, MD-Dr.Sethi Cardiology-Dr. Yates Decamp  Discharge Diagnoses: Present on Admission:  .Stroke Patent foramen ovale History of brain tumor    Medication List  As of 06/06/2011  1:35 PM   STOP taking these medications         aspirin 81 MG tablet         TAKE these medications         aspirin 325 MG tablet   Take 325 mg by mouth daily as needed. For pain      CALCIUM + D PO   Take by mouth 2 (two) times daily.      clopidogrel 75 MG tablet   Commonly known as: PLAVIX   Take 1 tablet (75 mg total) by mouth daily with breakfast.      diazepam 5 MG tablet   Commonly known as: VALIUM   Take 1 tablet (5 mg total) by mouth every 8 (eight) hours as needed (dizziness).      mulitivitamin with minerals Tabs   Take 1 tablet by mouth daily.      VAGIFEM VA   Place 1 application vaginally every Monday, Wednesday, and Friday.             Hospital Course: Present on Admission:  .Stroke: Primary problem. Patient is a 71 year old white female with past medical history of previous brain tumor who presented with dizziness. On initial evaluation in the emergency room she was found have evidence of previous infarcts as well as an acute CVA. Patient was brought in for further evaluation. Given previous infarct in distribution, there is concern for the possibility of embolic CVA. Patient underwent an echocardiogram which was unremarkable carotid Dopplers were as well. A TEE was done on 3/13 which showed evidence of a large patent foramen ovale. Followup lower from the Dopplers were negative. After discussion with cardiology and neurology, the plan will be for patient to go back and resume her aspirin, increasing her dose back to 325 mg (it had  been recently decreased down to 81 mg) as well as start her on Plavix. At this time Coumadin is not felt to be indicated.  Neurology felt that given the distribution of her previous infarcts, it was likely that this may be paroxysmal A. fib rather than because of her PFO. There is no evidence of atrial fibrillation during hospitalization and the plan will be for patient to followup with cardiology for a continuous telemetry monitoring to indeed decide if this is a to fibrillation. The family is also considering whether or not they will have cardiology see oh her PFO. No determinations have been made at this time. Physical and occupational therapy evaluated the patient and son O2 needs. Patient occasionally has episodes of dizziness although this is much improved since her initial presentation. Her other medical issues are stable during his hospitalization.   Day of Discharge BP 136/69  Pulse 65  Temp(Src) 97.2 F (36.2 C) (Oral)  Resp 20  Ht 5\' 3"  (1.6 m)  Wt 55.339 kg (122 lb)  BMI 21.61 kg/m2  SpO2 100%  LMP 03/27/1983  Physical Exam: Gen.: Alert and oriented x3, no acute distress, looks younger than stated age, no fatigue HEENT: Normocephalic, atraumatic, mucous her meds are moist Cardiovascular: Regular rate and rhythm S1-S2 Lungs: Clear auscultation bilaterally Abdomen: Soft,  nontender, nondistended, positive bowel sounds Extremities: No clubbing or cyanosis or edema Neuro: Nonfocal   Disposition: Improved, being discharged home   Follow-up Appts: Discharge Orders    Future Orders Please Complete By Expires   Diet - low sodium heart healthy      Increase activity slowly         Follow-up Information    Follow up with ALM,STEPHANIE, MD. (Please followup with your Dr. in 2-3 days if not improved)    Contact information:   [address] [place] [address] [phone]       Follow up with Gates Rigg, MD in 2 months.   Contact  information:   [address], [address] [facility] [place] [address] [phone]       Follow up with Pamella Pert, MD in 4 weeks. (For continuous telemetry-eval for paroxysmal Afib if symptoms worsen if symptoms worsen)    Contact information:   [address]  [place] [address] [phone]          Tests Needing Follow-up: None  Time spent in discharge (includes decision making & examination of pt): 55 minutes  Signed: Hollice Espy 06/06/2011, 1:35 PM

## 2011-06-06 NOTE — Discharge Instructions (Signed)
Dizziness Dizziness is a common problem. It is a feeling of unsteadiness or lightheadedness. You may feel like you are about to faint. Dizziness can lead to injury if you stumble or fall. A person of any age group can suffer from dizziness, but dizziness is more common in older adults. CAUSES  Dizziness can be caused by many different things, including:  Middle ear problems.   Standing for too long.   Infections.   An allergic reaction.   Aging.   An emotional response to something, such as the sight of blood.   Side effects of medicines.   Fatigue.   Problems with circulation or blood pressure.   Excess use of alcohol, medicines, or illegal drug use.   Breathing too fast (hyperventilation).   An arrhythmia or problems with your heart rhythm.   Low red blood cell count (anemia).   Pregnancy.   Vomiting, diarrhea, fever, or other illnesses that cause dehydration.   Diseases or conditions such as Parkinson's disease, high blood pressure (hypertension), diabetes, and thyroid problems.   Exposure to extreme heat.  DIAGNOSIS  To find the cause of your dizziness, your caregiver may do a physical exam, lab tests, radiologic imaging scans, or an electrocardiography test (ECG).  TREATMENT  Treatment of dizziness depends on the cause of your symptoms and can vary greatly. HOME CARE INSTRUCTIONS   Drink enough fluids to keep your urine clear or pale yellow. This is especially important in very hot weather. In the elderly, it is also important in cold weather.   If your dizziness is caused by medicines, take them exactly as directed. When taking blood pressure medicines, it is especially important to get up slowly.   Rise slowly from chairs and steady yourself until you feel okay.   In the morning, first sit up on the side of the bed. When this seems okay, stand slowly while holding onto something until you know your balance is fine.   If you need to stand in one place for a  long time, be sure to move your legs often. Tighten and relax the muscles in your legs while standing.   If dizziness continues to be a problem, have someone stay with you for a day or two. Do this until you feel you are well enough to stay alone. Have the person call your caregiver if he or she notices changes in you that are concerning.   Do not drive or use heavy machinery if you feel dizzy.  SEEK IMMEDIATE MEDICAL CARE IF:   Your dizziness or lightheadedness gets worse.   You feel nauseous or vomit.   You develop problems with talking, walking, weakness, or using your arms, hands, or legs.   You are not thinking clearly or you have difficulty forming sentences. It may take a friend or family member to determine if your thinking is normal.   You develop chest pain, abdominal pain, shortness of breath, or sweating.   Your vision changes.   You notice any bleeding.   You have side effects from medicine that seems to be getting worse rather than better.  MAKE SURE YOU:   Understand these instructions.   Will watch your condition.   Will get help right away if you are not doing well or get worse.  Document Released: 09/05/2000 Document Revised: 03/01/2011 Document Reviewed: 09/29/2010 [facility] Patient Information 2012 [place], [place].     STROKE/TIA DISCHARGE INSTRUCTIONS SMOKING Cigarette smoking nearly doubles your risk of having a stroke & is  the single most alterable risk factor  If you smoke or have smoked in the last 12 months, you are advised to quit smoking for your health.  Most of the excess cardiovascular risk related to smoking disappears within a year of stopping.  Ask you doctor about anti-smoking medications  [place] Quit Line: 1-800-QUIT NOW  Free Smoking Cessation Classes [phone]  CHOLESTEROL Know your levels; limit fat & cholesterol in your diet  Lipid Panel     Component Value Date/Time   CHOL 135 06/05/2011 0605   TRIG 109 06/05/2011 0605   HDL 39*  06/05/2011 0605   CHOLHDL 3.5 06/05/2011 0605   VLDL 22 06/05/2011 0605   LDLCALC 74 06/05/2011 0605      Many patients benefit from treatment even if their cholesterol is at goal.  Goal: Total Cholesterol (CHOL) less than 160  Goal:  Triglycerides (TRIG) less than 150  Goal:  HDL greater than 40  Goal:  LDL (LDLCALC) less than 100   BLOOD PRESSURE American Stroke Association blood pressure target is less that 120/80 mm/Hg  Your discharge blood pressure is:  BP: 132/75 mmHg  Monitor your blood pressure  Limit your salt and alcohol intake  Many individuals will require more than one medication for high blood pressure  DIABETES (A1c is a blood sugar average for last 3 months) Goal HGBA1c is under 7% (HBGA1c is blood sugar average for last 3 months)      PHYSICAL ACTIVITY/REHABILITATION Goal is 30 minutes at least 4 days per week    Activity:   No restrictions. and Increase activity slowly,  Activity decreases your risk of heart attack and stroke and makes your heart stronger.  It helps control your weight and blood pressure; helps you relax and can improve your mood.  Participate in a regular exercise program.  Talk with your doctor about the best form of exercise for you (dancing, walking, swimming, cycling).  DIET/WEIGHT Goal is to maintain a healthy weight  Your discharge diet is:  heart healthy liquids Your height is:  Height: 5\' 3"  (160 cm) Your current weight is: Weight: 55.339 kg (122 lb) Your Body Mass Index (BMI) is:  BMI (Calculated): 21.7   Following the type of diet specifically designed for you will help prevent another stroke.    Your goal Body Mass Index (BMI) is 19-24.  Healthy food habits can help reduce 3 risk factors for stroke:  High cholesterol, hypertension, and excess weight.  RESOURCES Stroke/Support Group:  Call [phone]  they meet the 3rd Sunday of the month on the Rehab Unit at [facility], [place] ( no meetings June, July & Aug).  STROKE EDUCATION  PROVIDED/REVIEWED AND GIVEN TO PATIENT Stroke warning signs and symptoms How to activate emergency medical system (call 911). Medications prescribed at discharge. Need for follow-up after discharge. Personal risk factors for stroke. Pneumonia vaccine given:   {STROKE DC YES/NO/DATE:22363} Flu vaccine given:   {STROKE DC YES/NO/DATE:22363} My questions have been answered, the writing is legible, and I understand these instructions.  I will adhere to these goals & educational materials that have been provided to me after my discharge from the hospital.

## 2011-06-06 NOTE — Progress Notes (Signed)
Bilateral lower extremity venous duplex completed at 11:30.  Preliminary report is negative for DVT, SVT, or a Baker's cyst.

## 2011-06-06 NOTE — Progress Notes (Signed)
Stroke Team Progress Note  HISTORY [name] is an 71 y.o. female who went to sleep last night at 10:30. She woke at 2:30 in the morning and noted she felt very dizzy. This continued even when she stood up. Do to dizzy sensation she was brought to the ED for further workup. MRI showed a small acute non hemorrhagic infarcts in the left frontal lobe and remote larger infarcts of the right frontal lobe and right cerebellum. At present time she is having no further symptoms other than dizziness when she initially stands up.  SUBJECTIVE Stable. No complaints. TEE shows PFO  OBJECTIVE Filed Vitals:   06/06/11 0820 06/06/11 0825 06/06/11 0830 06/06/11 0835  BP: 139/72 155/89 140/61 147/72  Pulse:      Temp:      TempSrc:      Resp: 21 16 16 13   Height:      Weight:      SpO2: 99% 100% 100% 100%    CBG (last 3) No results found for this basename: GLUCAP:3 in the last 72 hours Intake/Output from previous day: 03/12 0701 - 03/13 0700 In: 600 [P.O.:600] Out: -   IV Fluid Intake:     . sodium chloride    . sodium chloride 500 mL (06/06/11 0752)   Medications    . sodium chloride   Intravenous STAT  . aspirin  300 mg Rectal Daily   Or  . aspirin  325 mg Oral Daily  . clopidogrel  75 mg Oral Q breakfast  . fentaNYL  250 mcg Intravenous Once  . heparin  5,000 Units Subcutaneous Q8H  . midazolam  10 mg Intravenous Once  . mulitivitamin with minerals  1 tablet Oral Daily  . sodium chloride  3 mL Intravenous Q12H  PRN sodium chloride, benzocaine, butamben-tetracaine-benzocaine, fentaNYL, menthol-cetylpyridinium, midazolam, ondansetron (ZOFRAN) IV, senna-docusate, sodium chloride, zolpidem  Diet:  NPO Activity:  Bathroom privileges DVT Prophylaxis:    Significant Diagnostic Studies: CBC    Component Value Date/Time   WBC 5.7 06/04/2011 1557   RBC 3.92 06/04/2011 1557   HGB 12.0 06/04/2011 1557   HCT 36.4 06/04/2011 1557   PLT 157 06/04/2011 1557   MCV 92.9 06/04/2011 1557   MCH 30.6 06/04/2011 1557   MCHC 33.0 06/04/2011 1557   RDW 15.1 06/04/2011 1557   LYMPHSABS 2.3 06/02/2008 2025   MONOABS 0.5 06/02/2008 2025   EOSABS 0.2 06/02/2008 2025   BASOSABS 0.0 06/02/2008 2025   CMP    Component Value Date/Time   NA 141 06/05/2011 0605   K 4.1 06/05/2011 0605   CL 111 06/05/2011 0605   CO2 25 06/05/2011 0605   GLUCOSE 84 06/05/2011 0605   BUN 10 06/05/2011 0605   CREATININE 0.73 06/05/2011 0605   CALCIUM 8.1* 06/05/2011 0605   PROT 6.0 06/03/2008 1035   ALBUMIN 3.3* 06/03/2008 1035   AST 26 06/03/2008 1035   ALT 15 06/03/2008 1035   ALKPHOS 45 06/03/2008 1035   BILITOT 0.7 06/03/2008 1035   GFRNONAA 83* 06/05/2011 0605   GFRAA >90 06/05/2011 0605   COAGS Lab Results  Component Value Date   INR 1.1 06/03/2008   Lipid Panel    Component Value Date/Time   CHOL 135 06/05/2011 0605   TRIG 109 06/05/2011 0605   HDL 39* 06/05/2011 0605   CHOLHDL 3.5 06/05/2011 0605   VLDL 22 06/05/2011 0605   LDLCALC 74 06/05/2011 0605   HgbA1C  Lab Results  Component Value Date   HGBA1C  6.0* 06/05/2011   Urine Drug Screen  No results found for this basename: labopia, cocainscrnur, labbenz, amphetmu, thcu, labbarb    Alcohol Level No results found for this basename: eth     CT of the brain  Right cerebellar infarction, encephalomalacia underlying the right frontal parietal craniotomy, and white matter hypodensities, without significant interval change. No definite acute intracranial abnormality.  MRI of the brain    1.  Small acute non hemorrhagic infarcts in the left frontal lobe. 2.  Remote larger infarcts of the right frontal lobe and right cerebellum. 3.  Right maxillary sinus disease.   MRA of the brain    1.  No significant proximal stenosis, aneurysm, or branch vessel occlusion. 2.  Tortuous internal carotid arteries bilaterally. 3.  Mild to moderate small vessel disease.  2D Echocardiogram  EF 60-65% stenosis  Carotid Doppler  No internal carotid artery stenosis bilaterally.  Vertebrals with antegrade flow bilaterally.   CXR   1.  No acute abnormality. 2.  Stable changes of COPD and chronic bronchitis.  TEE moderate sized PFO with strongly postive double contrast study at rest with Valsalva. Mild AI. Otherwise normal.  Physical Exam  pleasant middle-aged Caucasian lady currently not in distress. Afebrile. Head is non-traumatic. Neck is supple without bruit. Hearing is normal. Cardiac exam no murmur or gallop. Lungs clear to auscultation. Abdomen soft nontender.  Neurological exam Awake Alert oriented x 3. Normal speech and language.eye movements full without nystagmus. Face symmetric. Tongue midline.  Normal strength, tone, reflexes and coordination. Normal sensation. Gait deferred.   ASSESSMENT Ms. [name] is a 71 y.o. female with left frontal embolic small infarct, secondary to unknown embolic source. On clopidogrel 75 mg orally every day for secondary stroke prevention. No therapy needs.  TEE shows moderate size PFO likely incidental  Hospital day # 2  TREATMENT/PLAN -bilateral lower extremity dopplers to rule out DVT as source of stroke (already ordered) If positive for clot may need warfarin. -Continue clopidogrel 75 mg orally every day for secondary stroke prevention. -ok for discharge from neuro standpoint once dopplers completed. -follow up Dr. Pearlean Brownie in 12mo.D/W Dr Rito Ehrlich. Stroke Team will sign off.   Dr. Delia Heady  06/06/2011 8:38 AM  .

## 2011-06-06 NOTE — Interval H&P Note (Signed)
History and Physical Interval Note:  06/06/2011 7:54 AM  [name]  has presented today for surgery, with the diagnosis of stroke  The various methods of treatment have been discussed with the patient and family. After consideration of risks, benefits and other options for treatment, the patient has consented to  Procedure(s) (LRB): TRANSESOPHAGEAL ECHOCARDIOGRAM (TEE) (N/A) as a surgical intervention .  The patients' history has been reviewed, patient examined, no change in status, stable for surgery.  I have reviewed the patients' chart and labs.  Questions were answered to the patient's satisfaction.     [facility] R  Stroke and suspect cardioembolic phenomenon. For TEE today.

## 2011-06-06 NOTE — Progress Notes (Signed)
Pt c/o sore throat. MD notified. Orders given for cepacol. Will cont to monitor

## 2011-06-06 NOTE — H&P (View-Only) (Signed)
Subjective: [name] is an 71 y.o. female who went to sleep last night at 10:30. She woke at 2:30 in the morning and noted she felt very dizzy. This continued even when she stood up. Do to dizzy sensation she was brought to the ED for further workup. MRI showed a small acute non hemorrhagic infarcts in the left frontal lobe and remote larger infarcts of the right frontal lobe and right cerebellum. At present time she is having no further symptoms other than dizziness when she initially stands up. She is doing well this am without any new complaints  Objective: Vital signs in last 24 hours: Temp:  [97.4 F (36.3 C)-98.7 F (37.1 C)] 97.4 F (36.3 C) (03/12 0931) Pulse Rate:  [68-86] 68  (03/12 0931) Resp:  [13-19] 18  (03/12 0931) BP: (95-143)/(56-83) 128/72 mmHg (03/12 0931) SpO2:  [95 %-98 %] 98 % (03/12 0931) Weight:  [55.339 kg (122 lb)] 55.339 kg (122 lb) (03/11 2200) Weight change:  Last BM Date: 06/03/11  Intake/Output from previous day: 03/11 0701 - 03/12 0700 In: 1200 [I.V.:1200] Out: -  Intake/Output this shift: Total I/O In: 360 [P.O.:360] Out: -   Physical exam pleasant middle-aged Caucasian lady currently not in distress. Afebrile. Head is non-traumatic. Neck is supple without bruit. Hearing is normal. Cardiac exam no murmur or gallop. Lungs clear to auscultation. Abdomen soft nontender. Neurological exam Awake  Alert oriented x 3. Normal speech and language.eye movements full without nystagmus. Face symmetric. Tongue midline. Normal strength, tone, reflexes and coordination. Normal sensation. Gait deferred.   Lab Results:  Basename 06/04/11 1557 06/04/11 0519  WBC 5.7 6.6  HGB 12.0 12.1  HCT 36.4 36.0  PLT 157 158   BMET  Basename 06/05/11 0605 06/04/11 1557 06/04/11 0519  NA 141 -- 139  K 4.1 -- 3.3*  CL 111 -- 104  CO2 25 -- 26  GLUCOSE 84 -- 77  BUN 10 -- 14  CREATININE 0.73 0.74 --  CALCIUM 8.1* -- 9.9    Studies/Results: Dg Chest 2  View  06/04/2011  *RADIOLOGY REPORT*  Clinical Data: Stroke.  CHEST - 2 VIEW  Comparison: 06/02/2008 and chest CTA dated 06/03/2008.  Findings: Normal sized heart.  Clear lungs.  Stable right diaphragmatic eventration.  The lungs remain hyperexpanded with mildly prominent interstitial markings and mild central peribronchial thickening.  Stable right breast implant capsular calcifications.  Mild to moderate scoliosis.  IMPRESSION:  1.  No acute abnormality. 2.  Stable changes of COPD and chronic bronchitis.  Original Report Authenticated By: Darrol Angel, M.D.   Ct Head Wo Contrast  06/04/2011  *RADIOLOGY REPORT*  Clinical Data: Dizziness  CT HEAD WITHOUT CONTRAST  Technique:  Contiguous axial images were obtained from the base of the skull through the vertex without contrast.  Comparison: 06/03/2008 MRI, 06/02/2008 CT  Findings: Encephalomalacia involving the right frontal parietal lobe. Medial right cerebellum remote infarction, similar to prior.  Remote right basal ganglia lacunar infarction versus prominent perivascular space. Prominence of the sulci, cisterns, and ventricles, in keeping with volume loss. There are subcortical and periventricular white matter hypodensities, a nonspecific finding most often seen with chronic microangiopathic changes.  There is no evidence for acute hemorrhage, overt hydrocephalus, mass lesion, or abnormal extra-axial fluid collection.  No definite CT evidence for acute cortical based (large artery) infarction. Right maxillary sinus mucosal thickening.  Possible postsurgical change.  Otherwise, the visualized paranasal sinuses and mastoid air cells are predominately clear.  Status post right frontoparietal  craniotomy.  IMPRESSION: Right cerebellar infarction, encephalomalacia underlying the right frontal parietal craniotomy, and white matter hypodensities, without significant interval change.  No definite acute intracranial abnormality.  Original Report Authenticated By:  Waneta Martins, M.D.   Mr [name] Head Wo Contrast  06/04/2011  *RADIOLOGY REPORT*  Clinical Data: Left frontal lobe infarcts.  MRA HEAD WITHOUT CONTRAST  Technique: Angiographic images of the Circle of Willis were obtained using MRA technique without intravenous contrast.  Comparison: MRI brain 06/04/2011.  MRA circle of Willis 06/03/2008.  Findings: The distal internal carotid arteries are tortuous bilaterally.  There is no focal stenosis.  The A1 and M1 segments are within normal limits.  A tiny anterior communicating artery is present.  The MCA bifurcations are within normal limits.  Mild small vessel disease is evident.  There is some attenuation of distal MCA branch vessels bilaterally.  No significant proximal stenosis or occlusion is evident.  The right vertebral artery slightly dominant left.  The PICA origins are visualized and within normal limits bilaterally.  The basilar artery is tortuous.  The right posterior cerebral artery is of fetal type.  There is mild irregularity of the proximal left P2 segment.  PCA branch vessel segmental attenuation is noted bilaterally.  IMPRESSION:  1.  No significant proximal stenosis, aneurysm, or branch vessel occlusion. 2.  Tortuous internal carotid arteries bilaterally. 3.  Mild to moderate small vessel disease.  Original Report Authenticated By: Jamesetta Orleans. MATTERN, M.D.   Mr Brain Wo Contrast  06/04/2011  *RADIOLOGY REPORT*  Clinical Data: The awoke at 02:00 a.m. with dizziness.  MRI HEAD WITHOUT CONTRAST  Technique:  Multiplanar, multiecho pulse sequences of the brain and surrounding structures were obtained according to standard protocol without intravenous contrast.  Comparison: CT head without contrast 06/04/2011.  MRI brain 06/03/2008.  Findings: The diffusion weighted images demonstrate three focal areas of acute infarction within the left frontal lobe.  The largest measures 11 mm.  There is no associated hemorrhage.  Remote infarcts of the right  cerebellum and right frontal lobe are noted.  Mild periventricular and subcortical white matter disease is evident otherwise.  Flow is present in the major intracranial arteries.  The globes and orbits are intact.  A fluid level is present in the right maxillary sinus.  Circumferential mucosal thickening is noted as well.  The paranasal sinuses and mastoid air cells are otherwise clear.  IMPRESSION:  1.  Small acute non hemorrhagic infarcts in the left frontal lobe. 2.  Remote larger infarcts of the right frontal lobe and right cerebellum. 3.  Right maxillary sinus disease.  These results were called by telephone on 06/04/2011  at  09:23 a.m. to  Dr. Juleen China, who verbally acknowledged these results.  Original Report Authenticated By: Jamesetta Orleans. MATTERN, M.D.      Carotid Dopplers preliminary report no significant extracranial stenosis     Medications: I have reviewed the patient's current medications.  Assessment/Plan: 8 year lady with embolic left frontal small infarcts. Previous history of right brain stroke and a silent stroke Plan : check TEE. Continue Plavix. Check echocardiogram lipid profile and hemoglobin A1c. Discussed with patient and husband.  LOS: 1 day   Kennedi Lizardo,PRAMODKUMAR P 06/05/2011, 12:14 PM

## 2011-06-07 ENCOUNTER — Encounter (HOSPITAL_COMMUNITY): Payer: Self-pay | Admitting: Cardiology

## 2011-07-03 ENCOUNTER — Other Ambulatory Visit (HOSPITAL_COMMUNITY): Payer: Self-pay | Admitting: Internal Medicine

## 2011-08-23 ENCOUNTER — Encounter (HOSPITAL_COMMUNITY): Payer: Self-pay | Admitting: Pharmacy Technician

## 2011-09-03 NOTE — H&P (Signed)
[name] is an 71 y.o. female.   Chief Complaint: PFO and recurrent stroke.  HPI: HOPI: Patient is presenting back to discuss further options of PFO treatment strategies.  She has no specific complaints.  Patient is a 71 year old white female with past medical history of previous brain tumor who was admitted with dizziness. to [facility] hospital 3/11-3/13/13:On initial evaluation in the emergency room she was found have evidence of previous infarcts as well as an acute CVA. Patient's presentation was consistent with an embolic stroke.  She has no other cardiovascular risk factors including hypertension, ABDs, hyperlipidemia.  She underwent further testing and was eventually discharged home.  She was found to have a very large PFO and she now presents to me for discussion regarding closure of the PFO and to discuss further preventive therapy.  Patient obviously very concerned that this is the third stroke in spite of being on aspirin.   Presently except for dizziness she has no other specific complaints.  Although she has noticed some visual disturbances and appears to find objects hazy and at times out of shape.  She denies any chest pain, shortness of breath, palpitations.  There is no PND or orthopnea.   Past Medical History  Diagnosis Date  . CIN I (cervical intraepithelial neoplasia I)   . Osteoporosis   . Stroke   . Hemorrhoids   . Brain tumor   . Hypertension   . Blood transfusion     " no reaction to transfusion "  . GERD (gastroesophageal reflux disease)   . Arthritis   . Kidney stone   . Depression   . Fibromyalgia     Past Surgical History  Procedure Date  . Tubal ligation   . Kidney stone surgery   . Brain biopsy   . Stomach surgery   . Colposcopy   . Breast enhancement surgery   . Tee without cardioversion 06/06/2011    Procedure: TRANSESOPHAGEAL ECHOCARDIOGRAM (TEE);  Surgeon: Pamella Pert, MD;  Location: [facility] ENDOSCOPY;  Service: Cardiovascular;  Laterality:  N/A;    Family History  Problem Relation Age of Onset  . Diabetes Mother   . Diabetes Maternal Grandfather   . Heart disease Paternal Grandfather   . Heart disease Father    Social History:  reports that she has never smoked. She has never used smokeless tobacco. She reports that she does not drink alcohol or use illicit drugs.  Allergies:  Allergies  Allergen Reactions  . Codeine     "wires her up"    Filed Vitals:   09/04/11 0603  BP: 139/70  Pulse: 63  Temp: 96.5 F (35.8 C)  TempSrc: Oral  Resp: 18  Height: 5\' 3"  (1.6 m)  Weight: 53.978 kg (119 lb)  SpO2: 97%     GENERAL: Moderately built and normal body habitus,  appears younger than stated age, no acute distress. Alert Ox3.    EYES: PERRLA, EOM's full, conjunctivae clear.  THROAT: Clear, no exudates, no lesions.  NECK: Supple, no masses, no thyromegaly, no JVD.    CHEST: Lungs clear, no rales, no rhonchi, no wheezes.    HEART: S1S2 normal,  no murmurs, no rubs, no gallops.    ABDOMEN: Soft, no tenderness, no masses, BS normal.    BACK: Normal curvature, no tenderness.  EXTREMITIES: Full range of motion, no deformities, no edema, no erythema.     NEURO: Alert, oriented x 3,  no localizing findings.    SKIN: Normal, no rashes, no lesions  noted. No ulcerations or signs of limb ischemia. No xanthelasma or Xanthoma.    Vascular exam: Normal carotid upstroke. No bruit. Abdominal aortic palpation appears to be normal without prominent pulsation. No bruit. Femoral and pedal pulses are normal.  Assessment/Plan  1. Recurrent stroke.  Admitted to [facility] from 3/11-3/13/13: Presenting with dizziness and embolic stroke.  Presently except for dizziness  which is improving she has no other specific complaints.  Although she has noticed some visual disturbances and appears to find objects hazy and at times out of shape.  She denies any chest pain, shortness of breath, palpitations.  There is no PND or orthopnea.    Final event report 3/19-4/10/13: NSR. No A. Fibrillation. No symptoms recorded. ECG 07/12/11: NSR @ 70/min, leftward axis. Otherwise normal. MRI Brain 06/04/11: Acute non hemorrhagic infarcts Lt frontal lobe. Large Rt frontal lobe, Rt cerebellar old stroke.   Labs 05/16/11 lipid profile reveals total cholesterol 135, triglycerides 109, HDL 39 and LDL was 74. TSH minmally abnormal at 5.258  MRA Head 06/04/11: No significant stenosis of intracerebral vessels. TEE 06/06/11: Large PFO with strongly positive right to left shunt at rest and Valsalva. Negative hypercoagulable work up and no DVT.  Plan:  I discussed all the available test results with the patient.  She has no other source of cerebral emboli apart from a very large PFO.  This is the third episode of stroke in spite of being on antiplatelet agent.  She has no hypertension, diabetes or hyperlipidemia.  Hence we have agreed upon to proceed with closure of the PFO.  She understands less than 1% risk of death, stroke, pericardial effusion, device embolization, bleeding as major complications but not limited to these.  Pamella Pert, MD 09/03/2011, 9:50 PM

## 2011-09-04 ENCOUNTER — Ambulatory Visit (HOSPITAL_COMMUNITY)
Admission: RE | Admit: 2011-09-04 | Discharge: 2011-09-05 | Disposition: A | Discharge status: Home / Self Care | Payer: Medicare Other | Source: Ambulatory Visit | Attending: Cardiology | Admitting: Cardiology

## 2011-09-04 ENCOUNTER — Encounter (HOSPITAL_COMMUNITY)
Admission: RE | Disposition: A | Discharge status: Home / Self Care | Payer: Self-pay | Source: Ambulatory Visit | Attending: Cardiology

## 2011-09-04 ENCOUNTER — Encounter (HOSPITAL_COMMUNITY): Payer: Self-pay | Admitting: General Practice

## 2011-09-04 DIAGNOSIS — Z8774 Personal history of (corrected) congenital malformations of heart and circulatory system: Secondary | ICD-10-CM | POA: Insufficient documentation

## 2011-09-04 DIAGNOSIS — Q2111 Secundum atrial septal defect: Secondary | ICD-10-CM | POA: Insufficient documentation

## 2011-09-04 DIAGNOSIS — Q211 Atrial septal defect: Secondary | ICD-10-CM | POA: Insufficient documentation

## 2011-09-04 DIAGNOSIS — Z8673 Personal history of transient ischemic attack (TIA), and cerebral infarction without residual deficits: Secondary | ICD-10-CM | POA: Insufficient documentation

## 2011-09-04 HISTORY — PX: PATENT FORAMEN OVALE CLOSURE: SHX5483

## 2011-09-04 HISTORY — DX: Atrial septal defect: Q21.1

## 2011-09-04 HISTORY — DX: Patent foramen ovale: Q21.12

## 2011-09-04 HISTORY — PX: PATENT FORAMEN OVALE CLOSURE: SHX2181

## 2011-09-04 HISTORY — DX: Personal history of (corrected) congenital malformations of heart and circulatory system: Z87.74

## 2011-09-04 LAB — POCT ACTIVATED CLOTTING TIME: Activated Clotting Time: 237 seconds

## 2011-09-04 SURGERY — PATENT FORAMEN OVALE CLOSURE
Anesthesia: LOCAL

## 2011-09-04 MED ORDER — FENTANYL CITRATE 0.05 MG/ML IJ SOLN
INTRAMUSCULAR | Status: AC
  Filled 2011-09-04: qty 2

## 2011-09-04 MED ORDER — HEPARIN (PORCINE) IN NACL 2-0.9 UNIT/ML-% IJ SOLN
INTRAMUSCULAR | Status: AC
  Filled 2011-09-04: qty 2000

## 2011-09-04 MED ORDER — SODIUM CHLORIDE 0.9 % IJ SOLN: 3.0000 mL | INTRAMUSCULAR | Status: DC | PRN

## 2011-09-04 MED ORDER — FENTANYL CITRATE 0.05 MG/ML IJ SOLN
25.0000 ug | INTRAMUSCULAR | Status: DC | PRN
  Administered 2011-09-04: 100 ug via INTRAVENOUS

## 2011-09-04 MED ORDER — SODIUM CHLORIDE 0.9 % IV SOLN
INTRAVENOUS | Status: DC
  Administered 2011-09-04: 07:00:00 via INTRAVENOUS

## 2011-09-04 MED ORDER — HEPARIN SODIUM (PORCINE) 1000 UNIT/ML IJ SOLN
INTRAMUSCULAR | Status: AC
  Filled 2011-09-04: qty 1

## 2011-09-04 MED ORDER — ADULT MULTIVITAMIN W/MINERALS CH
1.0000 | ORAL_TABLET | Freq: Every day | ORAL | Status: DC
  Administered 2011-09-05: 1 via ORAL
  Filled 2011-09-04 (×2): qty 1

## 2011-09-04 MED ORDER — SODIUM CHLORIDE 0.9 % IJ SOLN: 3.0000 mL | Freq: Two times a day (BID) | INTRAMUSCULAR | Status: DC

## 2011-09-04 MED ORDER — ACETAMINOPHEN 325 MG PO TABS: 650.0000 mg | ORAL_TABLET | ORAL | Status: DC | PRN

## 2011-09-04 MED ORDER — LIDOCAINE HCL (PF) 1 % IJ SOLN
INTRAMUSCULAR | Status: AC
  Filled 2011-09-04: qty 30

## 2011-09-04 MED ORDER — CLOPIDOGREL BISULFATE 75 MG PO TABS
300.0000 mg | ORAL_TABLET | ORAL | Status: AC
  Administered 2011-09-04: 300 mg via ORAL
  Filled 2011-09-04: qty 4

## 2011-09-04 MED ORDER — SODIUM CHLORIDE 0.9 % IV SOLN: 250.0000 mL | INTRAVENOUS | Status: DC

## 2011-09-04 MED ORDER — ONDANSETRON HCL 4 MG/2ML IJ SOLN: 4.0000 mg | Freq: Four times a day (QID) | INTRAMUSCULAR | Status: DC | PRN

## 2011-09-04 MED ORDER — ASPIRIN 81 MG PO CHEW
324.0000 mg | CHEWABLE_TABLET | ORAL | Status: AC
  Administered 2011-09-04: 324 mg via ORAL
  Filled 2011-09-04: qty 4

## 2011-09-04 MED ORDER — SODIUM CHLORIDE 0.9 % IV SOLN: 250.0000 mL | INTRAVENOUS | Status: DC | PRN

## 2011-09-04 MED ORDER — ASPIRIN 81 MG PO CHEW
81.0000 mg | CHEWABLE_TABLET | Freq: Every day | ORAL | Status: DC
  Administered 2011-09-05: 81 mg via ORAL
  Filled 2011-09-04: qty 1

## 2011-09-04 MED ORDER — FENTANYL CITRATE 0.05 MG/ML IJ SOLN
25.0000 ug | INTRAMUSCULAR | Status: DC | PRN
  Administered 2011-09-04: 25 ug via INTRAVENOUS
  Filled 2011-09-04: qty 2

## 2011-09-04 MED ORDER — ALPRAZOLAM 0.5 MG PO TABS
0.5000 mg | ORAL_TABLET | Freq: Four times a day (QID) | ORAL | Status: DC | PRN
  Administered 2011-09-04: 0.5 mg via ORAL
  Filled 2011-09-04: qty 1

## 2011-09-04 MED ORDER — CLOPIDOGREL BISULFATE 75 MG PO TABS
75.0000 mg | ORAL_TABLET | Freq: Every day | ORAL | Status: DC
  Administered 2011-09-05: 75 mg via ORAL
  Filled 2011-09-04: qty 1

## 2011-09-04 MED ORDER — MIDAZOLAM HCL 2 MG/2ML IJ SOLN
INTRAMUSCULAR | Status: AC
  Filled 2011-09-04: qty 2

## 2011-09-04 NOTE — Progress Notes (Signed)
Site area: right groin  Site Prior to Removal:  Level 1  Pressure Applied For 20 MINUTES    Minutes Beginning at 1200  Manual:   yes  Patient Status During Pull:  stable  Post Pull Groin Site:  Level 1  Post Pull Instructions Given:  yes  Post Pull Pulses Present:  yes  Dressing Applied:  yes  Comments:  Two sheaths pulled first one 1200 next one at 1210 both to right groin.

## 2011-09-04 NOTE — Interval H&P Note (Signed)
History and Physical Interval Note:  09/04/2011 6:27 AM  [name]  has presented today for surgery, with the diagnosis of asd  The various methods of treatment have been discussed with the patient and family. After consideration of risks, benefits and other options for treatment, the patient has consented to  Procedure(s) (LRB): PATENT FORAMEN OVALE CLOSURE (N/A) as a surgical intervention .  The patients' history has been reviewed, patient examined, no change in status, stable for surgery.  I have reviewed the patients' chart and labs.  Questions were answered to the patient's satisfaction.     Pamella Pert

## 2011-09-04 NOTE — CV Procedure (Signed)
Procedure Performed: 1. Intracardiac Echo Guided closure of the atrial septal defect: Type: PFO 2. Closure of the ASD with a 25 mm Helex Gore Septal Occluder.  Indication: Patient is a 71 year old woman with history of Recurrent stroke. Other etiology for cryptogenic stroke were ruled out and TEE had confirmed that patient had a large PFO with strongly positive double contrast study for right to left shunting. 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 Hence brought to the cardiac catheterization suite for closure of the PFO for secondary prevention of stroke  Interventional data: Successful closure of the atrial septal defect under intracardiac echo guidance with implantation of a 25 millimeter septal occluder. Post-deployment double contrast study revealed no residual shunting.  Recommendation: Patient will continue with dual antiplatelet therapy for a period of 30 days. She'll also need endocarditis prophylaxis for repair of 6 months. After that she can be on aspirin indefinitely or Plavix indefinitely depending on  clinical circumstances.    Technique: Under sterile precautions using 9 French right femoral venous access and a 6 French right femoral venous access a intracardiac echo probe was advanced under fluoroscopic guidance into the right atrium via 9 French sheath. Cardiac structures which are analyzed. The images were standardized and the device was fixed. Using confined to correlation I utilized a 290 cm Rosen wire and a multipurpose A2 catheter to cross the septal defect and the wire was carefully positioned in the left upper pulmonary vein. Maintaining ACT greater than 200 we then prepared the Helex septal occluder in the usual fashion making sure that there were no air bubbles in the device.  The 6 French right femoral venous access sheath was then exchanged to an 11 [place] sheath. And over the [facility] wire the Helex occluder was advanced under fluoroscopic and  intracardiac echo guidance and the eighth of the defect was crossed. The left atrial disc was carefully deployed and then after confirming appropriate deployment of the left into the, the right atrial disc was carefully deployed. The intracardiac images were carefully analyzed and fluoroscopic images with utilized to evaluate appropriate disc deployment on each side of the septal defect. After confirming there the device was released and the delivery system was pulled out of the body without any complications.  The venous access sheath was sutured in place and patient was transferred to recovery area in stable condition. There was no immediate competitions. Patient of procedure well. She'll be discharged home in the morning if she remains stable.

## 2011-09-05 MED ORDER — ASPIRIN 81 MG PO TABS: 325.0000 mg | ORAL_TABLET | Freq: Every day | ORAL | Status: DC | PRN

## 2011-09-05 NOTE — Discharge Instructions (Signed)
Need antibiotics if dental work planned up to 6 months from now.  Come to my office at 1:30 pm for echocardiogram tomorrow. To call if dyspnea, chest pain, leg swelling.    Groin Site Care Refer to this sheet in the next few weeks. These instructions provide you with information on caring for yourself after your procedure. Your caregiver may also give you more specific instructions. Your treatment has been planned according to current medical practices, but problems sometimes occur. Call your caregiver if you have any problems or questions after your procedure. HOME CARE INSTRUCTIONS  You may shower 24 hours after the procedure. Remove the bandage (dressing) and gently wash the site with plain soap and water. Gently pat the site dry.   Do not apply powder or lotion to the site.   Do not sit in a bathtub, swimming pool, or whirlpool for 5 to 7 days.   No bending, squatting, or lifting anything over 10 pounds (4.5 kg) as directed by your caregiver.   Inspect the site at least twice daily.   Do not drive home if you are discharged the same day of the procedure. Have someone else drive you.   You may drive 24 hours after the procedure unless otherwise instructed by your caregiver.  What to expect:  Any bruising will usually fade within 1 to 2 weeks.   Blood that collects in the tissue (hematoma) may be painful to the touch. It should usually decrease in size and tenderness within 1 to 2 weeks.  SEEK IMMEDIATE MEDICAL CARE IF:  You have unusual pain at the groin site or down the affected leg.   You have redness, warmth, swelling, or pain at the groin site.   You have drainage (other than a small amount of blood on the dressing).   You have chills.   You have a fever or persistent symptoms for more than 72 hours.   You have a fever and your symptoms suddenly get worse.   Your leg becomes pale, cool, tingly, or numb.   You have heavy bleeding from the site. Hold pressure on the  site.  Document Released: 04/14/2010 Document Revised: 03/01/2011 Document Reviewed: 04/14/2010 [facility] Patient Information 2012 [place], [place].

## 2011-09-05 NOTE — Discharge Summary (Signed)
Physician Discharge Summary  Patient ID: [name] MRN: [id number] DOB/AGE: February 26, 1941 71 y.o.  Admit date: 09/04/2011 Discharge date: 09/05/2011  Primary Discharge Diagnosis Atrial septal defect S/P successful PFO closure with 25 mm Helex Septal Occluder  Secondary Discharge Diagnosis  Significant Diagnostic Studies:  Consults:   Hospital Course: Patient is a 71 year old white female with past medical history of previous brain tumor who was admitted with dizziness. to [facility] hospital 3/11-3/13/13:On initial evaluation in the emergency room she was found have evidence of previous infarcts as well as an acute CVA. Patient's presentation was consistent with an embolic stroke.  Admitted to the hospital on an elective basis on 09/04/2011 and underwent successful procedure without any complications and the following day was felt to be stable for discharge. She denies any chest pain, shortness breath, palpitations, PND or orthopnea. She slept well.   Discharge Exam: Blood pressure 121/64, pulse 75, temperature 98 F (36.7 C), temperature source Oral, resp. rate 18, height 5\' 3"  (1.6 m), weight 55 kg (121 lb 4.1 oz), last menstrual period 03/27/1983, SpO2 93.00%.   GENERAL: Moderately built and normal body habitus, appears younger than stated age, no acute distress. Alert Ox3.  EYES: PERRLA, EOM's full, conjunctivae clear. THROAT: Clear, no exudates, no lesions. NECK: Supple, no masses, no thyromegaly, no JVD.  CHEST: Lungs clear, no rales, no rhonchi, no wheezes.  HEART: S1S2 normal, no murmurs, no rubs, no gallops.  ABDOMEN: Soft, no tenderness, no masses, BS normal.  BACK: Normal curvature, no tenderness. EXTREMITIES: Full range of motion, no deformities, no edema, no erythema.  NEURO: Alert, oriented x 3, no localizing findings.  SKIN: Normal, no rashes, no lesions noted. No ulcerations or signs of limb ischemia. No xanthelasma or Xanthoma.  Vascular exam: Normal carotid upstroke. No  bruit. Abdominal aortic palpation appears to be normal without prominent pulsation. No bruit. Femoral and pedal pulses are normal. Right groin site shows mild ecchymosis without any hematoma or any tenderness or discharge.  Telemetry reveals normal sinus rhythm without evidence of atrial arrhythmias. Labs:   Lab Results  Component Value Date   WBC 5.7 06/04/2011   HGB 12.0 06/04/2011   HCT 36.4 06/04/2011   MCV 92.9 06/04/2011   PLT 157 06/04/2011   No results found for this basename: NA,K,CL,CO2,BUN,CREATININE,CALCIUM,LABALBU,PROT,BILITOT,ALKPHOS,ALT,AST,GLUCOSE in the last 168 hours  Lipid Panel     Component Value Date/Time   CHOL 135 06/05/2011 0605   TRIG 109 06/05/2011 0605   HDL 39* 06/05/2011 0605   CHOLHDL 3.5 06/05/2011 0605   VLDL 22 06/05/2011 0605   LDLCALC 74 06/05/2011 0605      FOLLOW UP PLANS AND APPOINTMENTS  Medication List  As of 09/05/2011  8:44 AM   TAKE these medications         aspirin 81 MG tablet   Take 4 tablets (325 mg total) by mouth daily as needed. For pain      calcium citrate-vitamin D 315-200 MG-UNIT per tablet   Commonly known as: CITRACAL+D   Take 1 tablet by mouth 2 (two) times daily.      clopidogrel 75 MG tablet   Commonly known as: PLAVIX   Take 75 mg by mouth daily with breakfast.      fish oil-omega-3 fatty acids 1000 MG capsule   Take 1 g by mouth daily.      multivitamin with minerals Tabs   Take 1 tablet by mouth daily.      VAGIFEM VA   Place 1 application  vaginally every Monday, Wednesday, and Friday.      vitamin B-12 500 MCG tablet   Commonly known as: CYANOCOBALAMIN   Take 500 mcg by mouth daily.           Follow-up Information    Follow up with Pamella Pert, MD in 2 weeks. (Keep appointment)    Contact information:   [address]  [place] [address] [phone]           Pamella Pert, MD 09/05/2011, 8:44 AM

## 2011-09-10 ENCOUNTER — Encounter (HOSPITAL_COMMUNITY): Payer: Self-pay | Admitting: *Deleted

## 2011-09-10 ENCOUNTER — Emergency Department (HOSPITAL_COMMUNITY): Payer: Medicare Other

## 2011-09-10 ENCOUNTER — Inpatient Hospital Stay (HOSPITAL_COMMUNITY)
Admission: EM | Admit: 2011-09-10 | Discharge: 2011-09-12 | DRG: 369 | Disposition: A | Discharge status: Home / Self Care | Payer: Medicare Other | Attending: Internal Medicine | Admitting: Internal Medicine

## 2011-09-10 DIAGNOSIS — D62 Acute posthemorrhagic anemia: Secondary | ICD-10-CM | POA: Diagnosis present

## 2011-09-10 DIAGNOSIS — I10 Essential (primary) hypertension: Secondary | ICD-10-CM | POA: Diagnosis present

## 2011-09-10 DIAGNOSIS — K219 Gastro-esophageal reflux disease without esophagitis: Secondary | ICD-10-CM | POA: Diagnosis present

## 2011-09-10 DIAGNOSIS — Q2112 Patent foramen ovale: Secondary | ICD-10-CM

## 2011-09-10 DIAGNOSIS — K226 Gastro-esophageal laceration-hemorrhage syndrome: Principal | ICD-10-CM | POA: Diagnosis present

## 2011-09-10 DIAGNOSIS — Q211 Atrial septal defect: Secondary | ICD-10-CM

## 2011-09-10 DIAGNOSIS — R55 Syncope and collapse: Secondary | ICD-10-CM

## 2011-09-10 DIAGNOSIS — K922 Gastrointestinal hemorrhage, unspecified: Secondary | ICD-10-CM | POA: Diagnosis present

## 2011-09-10 DIAGNOSIS — K2901 Acute gastritis with bleeding: Secondary | ICD-10-CM

## 2011-09-10 DIAGNOSIS — Z8673 Personal history of transient ischemic attack (TIA), and cerebral infarction without residual deficits: Secondary | ICD-10-CM

## 2011-09-10 DIAGNOSIS — IMO0001 Reserved for inherently not codable concepts without codable children: Secondary | ICD-10-CM | POA: Diagnosis present

## 2011-09-10 DIAGNOSIS — M810 Age-related osteoporosis without current pathological fracture: Secondary | ICD-10-CM | POA: Diagnosis present

## 2011-09-10 DIAGNOSIS — I959 Hypotension, unspecified: Secondary | ICD-10-CM | POA: Diagnosis present

## 2011-09-10 LAB — PROTIME-INR
INR: 1.14 (ref 0.00–1.49)
Prothrombin Time: 14.8 seconds (ref 11.6–15.2)

## 2011-09-10 LAB — CBC
HCT: 30.8 % — ABNORMAL LOW (ref 36.0–46.0)
Hemoglobin: 10.1 g/dL — ABNORMAL LOW (ref 12.0–15.0)
MCH: 31.1 pg (ref 26.0–34.0)
MCHC: 32.8 g/dL (ref 30.0–36.0)
MCV: 94.8 fL (ref 78.0–100.0)
Platelets: 192 10*3/uL (ref 150–400)
RBC: 3.25 MIL/uL — ABNORMAL LOW (ref 3.87–5.11)
RDW: 14.1 % (ref 11.5–15.5)
WBC: 7.9 10*3/uL (ref 4.0–10.5)

## 2011-09-10 LAB — CARDIAC PANEL(CRET KIN+CKTOT+MB+TROPI)
CK, MB: 1.3 ng/mL (ref 0.3–4.0)
Relative Index: INVALID (ref 0.0–2.5)
Troponin I: <0.3 ng/mL (ref <0.30)

## 2011-09-10 LAB — HEMOGLOBIN: Hemoglobin: 8.7 g/dL — ABNORMAL LOW (ref 12.0–15.0)

## 2011-09-10 LAB — OCCULT BLOOD, POC DEVICE: Fecal Occult Bld: NEGATIVE

## 2011-09-10 LAB — DIFFERENTIAL
Basophils Absolute: 0 10*3/uL (ref 0.0–0.1)
Basophils Relative: 0 % (ref 0–1)
Eosinophils Absolute: 0.1 10*3/uL (ref 0.0–0.7)
Eosinophils Relative: 1 % (ref 0–5)
Lymphocytes Relative: 30 % (ref 12–46)
Lymphs Abs: 2.4 10*3/uL (ref 0.7–4.0)
Monocytes Absolute: 0.5 10*3/uL (ref 0.1–1.0)
Monocytes Relative: 7 % (ref 3–12)
Neutro Abs: 4.9 10*3/uL (ref 1.7–7.7)
Neutrophils Relative %: 62 % (ref 43–77)

## 2011-09-10 LAB — POCT I-STAT TROPONIN I: Troponin i, poc: 0.04 ng/mL (ref 0.00–0.08)

## 2011-09-10 LAB — HEMATOCRIT
HCT: 26.6 % — ABNORMAL LOW (ref 36.0–46.0)
HCT: 25.1 % — ABNORMAL LOW (ref 36.0–46.0)

## 2011-09-10 LAB — COMPREHENSIVE METABOLIC PANEL
AST: 16 U/L (ref 0–37)
Albumin: 2.8 g/dL — ABNORMAL LOW (ref 3.5–5.2)
Calcium: 9.6 mg/dL (ref 8.4–10.5)
Creatinine, Ser: 1.04 mg/dL (ref 0.50–1.10)
Total Protein: 6.3 g/dL (ref 6.0–8.3)

## 2011-09-10 LAB — MRSA PCR SCREENING: MRSA by PCR: NEGATIVE

## 2011-09-10 LAB — ABO/RH: ABO/RH(D): O POS

## 2011-09-10 MED ORDER — ONDANSETRON HCL 4 MG/2ML IJ SOLN
4.0000 mg | Freq: Four times a day (QID) | INTRAMUSCULAR | Status: DC | PRN
  Administered 2011-09-11: 4 mg via INTRAVENOUS
  Filled 2011-09-10: qty 2

## 2011-09-10 MED ORDER — OXYMETAZOLINE HCL 0.05 % NA SOLN
1.0000 | Freq: Once | NASAL | Status: AC
  Administered 2011-09-10: 1 via NASAL
  Filled 2011-09-10: qty 15

## 2011-09-10 MED ORDER — SODIUM CHLORIDE 0.9 % IV BOLUS (SEPSIS)
1000.0000 mL | Freq: Once | INTRAVENOUS | Status: AC
  Administered 2011-09-10: 1000 mL via INTRAVENOUS

## 2011-09-10 MED ORDER — SODIUM CHLORIDE 0.9 % IV SOLN
1000.0000 mL | INTRAVENOUS | Status: DC
  Administered 2011-09-10: 1000 mL via INTRAVENOUS

## 2011-09-10 MED ORDER — PANTOPRAZOLE SODIUM 40 MG IV SOLR
40.0000 mg | Freq: Once | INTRAVENOUS | Status: AC
  Administered 2011-09-10: 40 mg via INTRAVENOUS
  Filled 2011-09-10: qty 40

## 2011-09-10 MED ORDER — SUCRALFATE 1 GM/10ML PO SUSP
1.0000 g | Freq: Once | ORAL | Status: AC
  Administered 2011-09-10: 1 g via ORAL
  Filled 2011-09-10 (×2): qty 10

## 2011-09-10 MED ORDER — ONDANSETRON HCL 4 MG/2ML IJ SOLN
4.0000 mg | Freq: Once | INTRAMUSCULAR | Status: AC
  Administered 2011-09-10: 4 mg via INTRAVENOUS
  Filled 2011-09-10: qty 2

## 2011-09-10 MED ORDER — SODIUM CHLORIDE 0.9 % IV SOLN
INTRAVENOUS | Status: DC
  Administered 2011-09-11: 20:00:00 via INTRAVENOUS

## 2011-09-10 MED ORDER — SODIUM CHLORIDE 0.9 % IJ SOLN
3.0000 mL | Freq: Two times a day (BID) | INTRAMUSCULAR | Status: DC
  Administered 2011-09-10 – 2011-09-11 (×2): 3 mL via INTRAVENOUS

## 2011-09-10 MED ORDER — DIPHENHYDRAMINE HCL 25 MG PO CAPS
50.0000 mg | ORAL_CAPSULE | Freq: Four times a day (QID) | ORAL | Status: DC | PRN
  Administered 2011-09-10 – 2011-09-11 (×2): 50 mg via ORAL
  Filled 2011-09-10 (×2): qty 2

## 2011-09-10 MED ORDER — ALBUTEROL SULFATE (5 MG/ML) 0.5% IN NEBU: 2.5000 mg | INHALATION_SOLUTION | RESPIRATORY_TRACT | Status: DC | PRN

## 2011-09-10 MED ORDER — ONDANSETRON HCL 4 MG PO TABS: 4.0000 mg | ORAL_TABLET | Freq: Four times a day (QID) | ORAL | Status: DC | PRN

## 2011-09-10 MED ORDER — SODIUM CHLORIDE 0.9 % IV SOLN: INTRAVENOUS | Status: DC

## 2011-09-10 MED ORDER — SODIUM CHLORIDE 0.9 % IV SOLN
1000.0000 mL | Freq: Once | INTRAVENOUS | Status: AC
  Administered 2011-09-10: 1000 mL via INTRAVENOUS

## 2011-09-10 MED ORDER — ONDANSETRON HCL 4 MG/2ML IJ SOLN: 4.0000 mg | Freq: Three times a day (TID) | INTRAMUSCULAR | Status: DC | PRN

## 2011-09-10 MED ORDER — SODIUM CHLORIDE 0.9 % IV SOLN
8.0000 mg/h | INTRAVENOUS | Status: DC
  Administered 2011-09-10 – 2011-09-11 (×2): 8 mg/h via INTRAVENOUS
  Filled 2011-09-10 (×5): qty 80

## 2011-09-10 NOTE — ED Notes (Signed)
Paged IV team for 2nd IV line.

## 2011-09-10 NOTE — ED Notes (Signed)
IV team cannot find a 2nd site for IV.  Unable to place NG tube after multiple attempts.  Attempted 12 Fr, 10 Fr.  Attempted after Afrin nasal spray with no success.  Pt states she cannot stand it.

## 2011-09-10 NOTE — H&P (Signed)
PCP:   Ancil Boozer, MD   Primary Cardiologist: Dr. Delrae Rend.  Chief Complaint:  Vomiting black colored material x4, this morning  HPI: 71 year old female patient with history of previous brain tumor, CVAs, hypertension, GERD, recent hospitalization for PFO closure presents to the emergency department due to vomiting black colored material. Since her discharge from the hospital on 09/05/11, she has generally been weak with poor appetite. She was newly started on aspirin 324 mg daily in addition to previous Plavix 75 mg daily. In addition she has been taking 2 tablets of Advil PM nightly for arthritic pains. Between 9:15 - 9:30 AM today, she had 4 episodes of vomiting black colored material. There was no bright red blood. She denied abdominal pain. Last BM on 6/16 was normal colored. She felt dizzy like she was nearly going to pass out, but did not. She then went and laid down on a reclining chair and called her primary care physician's office and was advised to come to the emergency department. According to her family she looked pale. She denied chest pain but had some dyspnea which has since resolved. In the emergency department her hemoglobin was 10 g/dL, down from 12 g/dL on 1/61/0960. CT abdomen did not show acute findings. She was bolused with IV fluids. She feels mildly lightheaded but significantly better than on admission. She has had no further episodes of vomiting. Attempts to pass NG tube were unsuccessful. The ED physician has consulted the gastroenterologist and the hospitalist service as been requested to admit for further evaluation and management.  Past Medical History: Past Medical History  Diagnosis Date  . CIN I (cervical intraepithelial neoplasia I)   . Osteoporosis   . Stroke   . Hemorrhoids   . Brain tumor   . Hypertension   . Blood transfusion     " no reaction to transfusion "  . GERD (gastroesophageal reflux disease)   . Arthritis   . Kidney stone   .  Depression   . Fibromyalgia   . PFO (patent foramen ovale)     Past Surgical History: Past Surgical History  Procedure Date  . Tubal ligation   . Kidney stone surgery   . Brain biopsy   . Stomach surgery   . Colposcopy   . Breast enhancement surgery   . Tee without cardioversion 06/06/2011    Procedure: TRANSESOPHAGEAL ECHOCARDIOGRAM (TEE);  Surgeon: Pamella Pert, MD;  Location: [facility] ENDOSCOPY;  Service: Cardiovascular;  Laterality: N/A;  . Patent foramen ovale closure 09/04/2011    Allergies:   Allergies  Allergen Reactions  . Codeine     "wires her up"  . Lactose Intolerance (Gi) Other (See Comments)    Medications: Prior to Admission medications   Medication Sig Start Date End Date Taking? Authorizing Provider  aspirin EC 81 MG tablet Take 324 mg by mouth daily.   Yes Historical Provider, MD  calcium citrate-vitamin D (CITRACAL+D) 315-200 MG-UNIT per tablet Take 2 tablets by mouth daily.    Yes Historical Provider, MD  clopidogrel (PLAVIX) 75 MG tablet Take 75 mg by mouth daily with breakfast. 06/06/11 06/05/12 Yes Hollice Espy, MD  DHEA 25 MG CAPS Take 1 capsule by mouth daily.   Yes Historical Provider, MD  Estradiol [facility]) Place 1 application vaginally every Monday, Wednesday, and Friday.   Yes Historical Provider, MD  fish oil-omega-3 fatty acids 1000 MG capsule Take 1 g by mouth daily.   Yes Historical Provider, MD  Ibuprofen-Diphenhydramine Cit (IBUPROFEN PM  PO) Take 1 tablet by mouth at bedtime as needed. For sleep   Yes Historical Provider, MD  Multiple Vitamin (MULITIVITAMIN WITH MINERALS) TABS Take 1 tablet by mouth daily.   Yes Historical Provider, MD  OVER THE COUNTER MEDICATION Take 18 drops by mouth daily. ETDA- PUTS IN DRINK   Yes Historical Provider, MD  vitamin B-12 (CYANOCOBALAMIN) 500 MCG tablet Take 500 mcg by mouth daily.   Yes Historical Provider, MD    Family History: Family History  Problem Relation Age of Onset  . Diabetes Mother     . Diabetes Maternal Grandfather   . Heart disease Paternal Grandfather   . Heart disease Father     Social History:  reports that she has never smoked. She has never used smokeless tobacco. She reports that she does not drink alcohol or use illicit drugs. patient is married. She is independent of activities of daily living.  Review of Systems:  All systems reviewed and apart from history of present illness is pertinent for bruising right groin region (postprocedure) but is decreasing. Otherwise negative.  Physical Exam: Filed Vitals:   09/10/11 1225 09/10/11 1254 09/10/11 1358 09/10/11 1547  BP: 109/62 107/56 106/52 104/40  Pulse: 85   68  Resp: 20  17 18   SpO2: 100% 100% 97% 98%   General appearance: Moderately built and nourished female patient who is lying comfortably in the gurney and is in no obvious distress.  Head: Nontraumatic and normocephalic.  Eyes: Pupils equally reacting to light and accommodation.  Ears: Normal  Nose: No acute findings. No sinus tenderness.  Throat: Mucosa is moist and pale. No oral thrush. No coffee grounds or blood seen. Neck: Supple. No JVD or carotid bruit. Lymph nodes: No lymphadenopathy.  Resp: Clear to auscultation. No increased work of breathing.  Cardio: First and second heart sounds heard, regular rate and rythm. No murmurs or JVD or gallop or pedal edema.   GI: Non distended. Soft and nontender. No organomegaly or masses appreciated. Normal bowel sounds heard. A band of bruising seen in the right upper anterior thigh/groin region, but seems to be fading. Extremities: symmetric 5/5 power. Skin: No other acute findings.  Neurologic: Alert and oriented. No focal neurological deficits.   Labs on Admission:   [facility] 09/10/11 1310  NA 140  K 4.2  CL 104  CO2 25  GLUCOSE 89  BUN 44*  CREATININE 1.04  CALCIUM 9.6  MG --  PHOS --    Basename 09/10/11 1310  AST 16  ALT 9  ALKPHOS 46  BILITOT 0.4  PROT 6.3  ALBUMIN 2.8*   No  results found for this basename: LIPASE:2,AMYLASE:2 in the last 72 hours  Basename 09/10/11 1310  WBC 7.9  NEUTROABS 4.9  HGB 10.1*  HCT 30.8*  MCV 94.8  PLT 192    Basename 09/10/11 1311  CKTOTAL 42  CKMB 1.3  CKMBINDEX --  TROPONINI <0.30   No results found for this basename: TSH,T4TOTAL,FREET3,T3FREE,THYROIDAB in the last 72 hours No results found for this basename: VITAMINB12:2,FOLATE:2,FERRITIN:2,TIBC:2,IRON:2,RETICCTPCT:2 in the last 72 hours  Radiological Exams on Admission: Ct Abdomen Pelvis Wo Contrast  09/10/2011  *RADIOLOGY REPORT*  Clinical Data: Near-syncope, hematemesis  CT ABDOMEN AND PELVIS WITHOUT CONTRAST  Technique:  Multidetector CT imaging of the abdomen and pelvis was performed following the standard protocol without intravenous contrast.  Comparison: None.  Findings: Lung bases are unremarkable.  Serpiginous high density material in the region of the right atrium may be postsurgical in nature.  Clinical correlation is necessary.  Sagittal images of the spine shows degenerative changes thoracolumbar spine.  Study is limited without IV contrast.  Unenhanced liver is unremarkable.  No calcified gallstones are noted within gallbladder.  Unenhanced pancreas, spleen and adrenal glands are unremarkable. Small amount of high density material noted within stomach.  Unenhanced kidneys are symmetrical in size.  No hydronephrosis or hydroureter.  No calcified ureteral calculi are noted bilaterally. There is a nonobstructive small calcified calculus in lower pole of the right kidney measures 2 mm.   A vascular calcification noted in the right gonadal vein.  Mild atherosclerotic calcifications are noted distal abdominal aorta to.  Vascular calcifications are noted bilateral adnexal region.  Multiple sigmoid colon diverticula are noted without evidence of acute diverticulitis.  The urinary bladder is under distended limiting its assessment.  No small bowel obstruction.  No ascites or  free air.  No adenopathy.  There is no pericecal inflammation.  Normal appendix is partially visualized in axial image 62.  IMPRESSION:  1.  There is  right nonobstructive nephrolithiasis.  No hydronephrosis or hydroureter.  Bilateral no calcified ureteral calculi are noted. 2.  No small bowel obstruction. 3.  No pericecal inflammation.  Normal appendix is partially visualized. 4.  Multiple sigmoid colon diverticula are noted without evidence of acute diverticulitis.  Limited assessment of the urinary bladder which is under distended.  Original Report Authenticated By: Natasha Mead, M.D.      Assessment/Plan Present on Admission:  .Acute upper GI bleeding .Acute posthemorrhagic anemia .Near syncope  1. Acute upper GI bleeding: Rule out NSAID-induced ulcers versus gastritis. Admit to step down. N.p.o. IV Protonix infusion.[facility] gastroenterology consulted and await their recommendations-? EGD this afternoon versus in the a.m. 2. Acute post hemorrhagic anemia: Secondary to GI bleeding. Place 2 large bore IV access is, type and screen, follow H&H frequently and transfuse when necessary. 3. Near syncope: Secondary to acute GI bleed. Management as above. IV fluids. Monitor. 4. History of CVAs: Discussed with patient's primary cardiologist Dr. Sherren Kerns who advised that the patient can be transitioned to entry coated Aspirin 81 mg daily when cleared to take antiplatelet agents. He indicated that her biggest risk for CVA as was her PFO which has been closed and hence the above change. 5. History of hypertension: Not on medications. Soft blood pressures currently. Monitor. 6. Status post recent PFO closure. 7. Full code. This was confirmed with the patient.  Discussed patient's care at length with the patient, her spouse and daughter at the bedside and updated care and answered questions.  Janetta Vandoren 09/10/2011, 4:44 PM

## 2011-09-10 NOTE — ED Notes (Signed)
Report at bedside to [facility], [place].

## 2011-09-10 NOTE — ED Provider Notes (Signed)
History     CSN: [id number]  Arrival date & time 09/10/11  1224   First MD Initiated Contact with Patient 09/10/11 1244      Chief Complaint  Patient presents with  . Near Syncope  . Hematemesis    (Consider location/radiation/quality/duration/timing/severity/associated sxs/prior treatment) HPI Comments: Patient presents with nausea and vomiting that started this morning. She's had 4 separate episodes of dark black-colored emesis. She did not see any bright red blood. She has a history of ulcers and required cauterization in the past. She is on aspirin and Plavix for history of strokes. She had a closure of a patent foramen ovale last week by Dr. Jacinto Halim. She has lightheadedness with standing, dizziness without chest pain or shortness of breath. She denies any abdominal pain. Her back pain is chronic.  The history is provided by the patient and the spouse.    Past Medical History  Diagnosis Date  . CIN I (cervical intraepithelial neoplasia I)   . Osteoporosis   . Stroke   . Hemorrhoids   . Brain tumor   . Hypertension   . Blood transfusion     " no reaction to transfusion "  . GERD (gastroesophageal reflux disease)   . Arthritis   . Kidney stone   . Depression   . Fibromyalgia   . PFO (patent foramen ovale)     Past Surgical History  Procedure Date  . Tubal ligation   . Kidney stone surgery   . Brain biopsy   . Stomach surgery   . Colposcopy   . Breast enhancement surgery   . Tee without cardioversion 06/06/2011    Procedure: TRANSESOPHAGEAL ECHOCARDIOGRAM (TEE);  Surgeon: Pamella Pert, MD;  Location: [facility] ENDOSCOPY;  Service: Cardiovascular;  Laterality: N/A;  . Patent foramen ovale closure 09/04/2011    Family History  Problem Relation Age of Onset  . Diabetes Mother   . Diabetes Maternal Grandfather   . Heart disease Paternal Grandfather   . Heart disease Father     History  Substance Use Topics  . Smoking status: Never Smoker   . Smokeless tobacco:  Never Used  . Alcohol Use: No    OB History    Grav Para Term Preterm Abortions TAB SAB Ect Mult Living   4 3 3  1  1   3       Review of Systems  Constitutional: Negative for fever, activity change and appetite change.  HENT: Negative for congestion and rhinorrhea.   Respiratory: Negative for cough, chest tightness and shortness of breath.   Gastrointestinal: Positive for nausea and vomiting. Negative for abdominal pain, constipation and blood in stool.  Genitourinary: Negative for dysuria.  Musculoskeletal: Negative for back pain.  Skin: Negative for rash.  Neurological: Positive for dizziness, weakness and light-headedness.    Allergies  Codeine and Lactose intolerance (gi)  Home Medications   Current Outpatient Rx  Name Route Sig Dispense Refill  . ASPIRIN EC 81 MG PO TBEC Oral Take 324 mg by mouth daily.    Marland Kitchen CALCIUM CITRATE-VITAMIN D 315-200 MG-UNIT PO TABS Oral Take 2 tablets by mouth daily.     Marland Kitchen CLOPIDOGREL BISULFATE 75 MG PO TABS Oral Take 75 mg by mouth daily with breakfast.    . DHEA 25 MG PO CAPS Oral Take 1 capsule by mouth daily.    Marland Kitchen VAGIFEM VA Vaginal Place 1 application vaginally every Monday, Wednesday, and Friday.    . OMEGA-3 FATTY ACIDS 1000 MG PO CAPS Oral  Take 1 g by mouth daily.    . IBUPROFEN PM PO Oral Take 1 tablet by mouth at bedtime as needed. For sleep    . ADULT MULTIVITAMIN W/MINERALS CH Oral Take 1 tablet by mouth daily.    Marland Kitchen OVER THE COUNTER MEDICATION Oral Take 18 drops by mouth daily. ETDA- PUTS IN DRINK    . VITAMIN B-12 500 MCG PO TABS Oral Take 500 mcg by mouth daily.      BP 106/52  Pulse 85  Resp 17  SpO2 97%  LMP 03/27/1983  Physical Exam  Constitutional: She is oriented to person, place, and time. She appears well-developed and well-nourished. No distress.  HENT:  Head: Normocephalic and atraumatic.  Mouth/Throat: Oropharynx is clear and moist.       Pale conjunctiva  Eyes: Conjunctivae are normal.  Neck: Normal range of  motion.  Cardiovascular: Normal rate, regular rhythm and normal heart sounds.   No murmur heard. Pulmonary/Chest: Effort normal and breath sounds normal. No respiratory distress.  Abdominal: Soft. There is no tenderness. There is no rebound and no guarding.  Musculoskeletal: Normal range of motion. She exhibits no edema and no tenderness.       Healing right groin ecchymosis  Neurological: She is alert and oriented to person, place, and time. No cranial nerve deficit.  Skin: Skin is warm.    ED Course  Procedures (including critical care time)  Labs Reviewed  CBC - Abnormal; Notable for the following:    RBC 3.25 (*)     Hemoglobin 10.1 (*)     HCT 30.8 (*)     All other components within normal limits  COMPREHENSIVE METABOLIC PANEL - Abnormal; Notable for the following:    BUN 44 (*)     Albumin 2.8 (*)     GFR calc non Af Amer 52 (*)     GFR calc Af Amer 61 (*)     All other components within normal limits  DIFFERENTIAL  APTT  PROTIME-INR  TYPE AND SCREEN  CARDIAC PANEL(CRET KIN+CKTOT+MB+TROPI)  OCCULT BLOOD, POC DEVICE  ABO/RH   Ct Abdomen Pelvis Wo Contrast  09/10/2011  *RADIOLOGY REPORT*  Clinical Data: Near-syncope, hematemesis  CT ABDOMEN AND PELVIS WITHOUT CONTRAST  Technique:  Multidetector CT imaging of the abdomen and pelvis was performed following the standard protocol without intravenous contrast.  Comparison: None.  Findings: Lung bases are unremarkable.  Serpiginous high density material in the region of the right atrium may be postsurgical in nature.  Clinical correlation is necessary.  Sagittal images of the spine shows degenerative changes thoracolumbar spine.  Study is limited without IV contrast.  Unenhanced liver is unremarkable.  No calcified gallstones are noted within gallbladder.  Unenhanced pancreas, spleen and adrenal glands are unremarkable. Small amount of high density material noted within stomach.  Unenhanced kidneys are symmetrical in size.  No  hydronephrosis or hydroureter.  No calcified ureteral calculi are noted bilaterally. There is a nonobstructive small calcified calculus in lower pole of the right kidney measures 2 mm.   A vascular calcification noted in the right gonadal vein.  Mild atherosclerotic calcifications are noted distal abdominal aorta to.  Vascular calcifications are noted bilateral adnexal region.  Multiple sigmoid colon diverticula are noted without evidence of acute diverticulitis.  The urinary bladder is under distended limiting its assessment.  No small bowel obstruction.  No ascites or free air.  No adenopathy.  There is no pericecal inflammation.  Normal appendix is partially visualized in axial  image 62.  IMPRESSION:  1.  There is  right nonobstructive nephrolithiasis.  No hydronephrosis or hydroureter.  Bilateral no calcified ureteral calculi are noted. 2.  No small bowel obstruction. 3.  No pericecal inflammation.  Normal appendix is partially visualized. 4.  Multiple sigmoid colon diverticula are noted without evidence of acute diverticulitis.  Limited assessment of the urinary bladder which is under distended.  Original Report Authenticated By: Natasha Mead, M.D.     No diagnosis found.    MDM  Nausea and vomiting of dark colored emesis with dizziness and lightheadedness. No chest pain or shortness of breath. Using both ASA, plavix and ibuprofen frequently.  IV fluids, protonix, NG tube, labs CT abdomen and pelvis to evaluate for retroperitoneal hematoma.  CT negative for retroperitoneal hematoma. Hemoglobin is decreased from 12 to 10. Patient unable to tolerate NG tube despite multiple attempts. She has soft blood pressures in the 100s with a normal heart rate in the 70s and 80s. FOBT negative. Blood crossmatched.  No further vomiting in the ED. Case discussed with Dr. Waymon Amato who will admit patient to step down. Continue PPI.  D/w Dr, Marjorie Smolder of [facility] who will consult on patient.      Date:  09/10/2011  Rate: 93  Rhythm: normal sinus rhythm  QRS Axis: normal  Intervals: normal  ST/T Wave abnormalities: normal  Conduction Disutrbances:none  Narrative Interpretation:   Old EKG Reviewed: unchanged  CRITICAL CARE Performed by: Glynn Octave   Total critical care time: 40  Critical care time was exclusive of separately billable procedures and treating other patients.  Critical care was necessary to treat or prevent imminent or life-threatening deterioration.  Critical care was time spent personally by me on the following activities: development of treatment plan with patient and/or surrogate as well as nursing, discussions with consultants, evaluation of patient's response to treatment, examination of patient, obtaining history from patient or surrogate, ordering and performing treatments and interventions, ordering and review of laboratory studies, ordering and review of radiographic studies, pulse oximetry and re-evaluation of patient's condition.   Glynn Octave, MD 09/11/11 [phone]

## 2011-09-10 NOTE — Consult Note (Signed)
Referring Provider: Marcellus Scott [facility]) Primary Care Physician:  Ancil Boozer, MD Primary Gastroenterologist:  Dr. Laural Benes  Reason for Consultation:  Coffee-ground emesis, GI bleed with anemia  HPI: [name] is a 71 y.o. female admitted through the emergency room this morning following 4 episodes of moderate volume coffee ground emesis with presyncope. No melena. Mild prodromal dyspeptic symptoms, characterized by early satiety, it also awakening in the middle of the night with a hungry feeling, prompting her to have a snack at 3 in the morning. Risk factors for GI bleeding include frequent use of Advil, also, recent institution of aspirin and Plavix following treatment for a patent foramen ovale by her cardiologist last week. Furthermore, the patient had a bleeding ulcer associated with ibuprofen usage 10 years ago. Since coming into the hospital, her orthostatic symptoms have improved, she feels stronger, and there has been no further hematemesis, although there has been a further 2 g drop in hemoglobin consistent with equilibration following IV fluid administration.   Past Medical History  Diagnosis Date  . CIN I (cervical intraepithelial neoplasia I)   . Osteoporosis   . Stroke   . Hemorrhoids   . Brain tumor   . Hypertension   . Blood transfusion     " no reaction to transfusion "  . GERD (gastroesophageal reflux disease)   . Arthritis   . Kidney stone   . Depression   . Fibromyalgia   . PFO (patent foramen ovale)     Past Surgical History  Procedure Date  . Tubal ligation   . Kidney stone surgery   . Brain biopsy   . Stomach surgery   . Colposcopy   . Breast enhancement surgery   . Tee without cardioversion 06/06/2011    Procedure: TRANSESOPHAGEAL ECHOCARDIOGRAM (TEE);  Surgeon: Pamella Pert, MD;  Location: [facility] END[facility];  Service: Cardiovascular;  Laterality: N/A;  . Patent foramen ovale closure 09/04/2011    Prior to Admission medications     Medication Sig Start Date End Date Taking? Authorizing Provider  aspirin EC 81 MG tablet Take 324 mg by mouth daily.   Yes Historical Provider, MD  calcium citrate-vitamin D (CITRACAL+D) 315-200 MG-UNIT per tablet Take 2 tablets by mouth daily.    Yes Historical Provider, MD  clopidogrel (PLAVIX) 75 MG tablet Take 75 mg by mouth daily with breakfast. 06/06/11 06/05/12 Yes Hollice Espy, MD  DHEA 25 MG CAPS Take 1 capsule by mouth daily.   Yes Historical Provider, MD  Estradiol [facility]) Place 1 application vaginally every Monday, Wednesday, and Friday.   Yes Historical Provider, MD  fish oil-omega-3 fatty acids 1000 MG capsule Take 1 g by mouth daily.   Yes Historical Provider, MD  Ibuprofen-Diphenhydramine Cit (IBUPROFEN PM PO) Take 1 tablet by mouth at bedtime as needed. For sleep   Yes Historical Provider, MD  Multiple Vitamin (MULITIVITAMIN WITH MINERALS) TABS Take 1 tablet by mouth daily.   Yes Historical Provider, MD  OVER THE COUNTER MEDICATION Take 18 drops by mouth daily. ETDA- PUTS IN DRINK   Yes Historical Provider, MD  vitamin B-12 (CYANOCOBALAMIN) 500 MCG tablet Take 500 mcg by mouth daily.   Yes Historical Provider, MD    Current Facility-Administered Medications  Medication Dose Route Frequency Provider Last Rate Last Dose  . 0.9 %  sodium chloride infusion  1,000 mL Intravenous Once Glynn Octave, MD 999 mL/hr at 09/10/11 1410 1,000 mL at 09/10/11 1410  . 0.9 %  sodium chloride infusion  Intravenous Continuous Elease Etienne, MD      . albuterol (PROVENTIL) (5 MG/ML) 0.5% nebulizer solution 2.5 mg  2.5 mg Nebulization Q2H PRN Elease Etienne, MD      . ondansetron [facility]) injection 4 mg  4 mg Intravenous Once Glynn Octave, MD   4 mg at 09/10/11 1459  . ondansetron (ZOFRAN) tablet 4 mg  4 mg Oral Q6H PRN Elease Etienne, MD       Or  . ondansetron (ZOFRAN) injection 4 mg  4 mg Intravenous Q6H PRN Elease Etienne, MD      . oxymetazoline (AFRIN) 0.05 % nasal  spray 1 spray  1 spray Each Nare Once Glynn Octave, MD   1 spray at 09/10/11 1459  . pantoprazole (PROTONIX) 80 mg in sodium chloride 0.9 % 250 mL infusion  8 mg/hr Intravenous Continuous Glynn Octave, MD 25 mL/hr at 09/10/11 1643 8 mg/hr at 09/10/11 1643  . pantoprazole (PROTONIX) injection 40 mg  40 mg Intravenous Once Glynn Octave, MD   40 mg at 09/10/11 1459  . sodium chloride 0.9 % bolus 1,000 mL  1,000 mL Intravenous Once Glynn Octave, MD   1,000 mL at 09/10/11 1312  . sodium chloride 0.9 % injection 3 mL  3 mL Intravenous Q12H Elease Etienne, MD      . sucralfate (CARAFATE) 1 GM/10ML suspension 1 g  1 g Oral Once Florencia Reasons, MD      . DISCONTD: 0.9 %  sodium chloride infusion  1,000 mL Intravenous Continuous Glynn Octave, MD 125 mL/hr at 09/10/11 1623 1,000 mL at 09/10/11 1623  . DISCONTD: 0.9 %  sodium chloride infusion   Intravenous STAT Glynn Octave, MD      . DISCONTD: ondansetron [facility]) injection 4 mg  4 mg Intravenous Q8H PRN Glynn Octave, MD        Allergies as of 09/10/2011 - Review Complete 09/10/2011  Allergen Reaction Noted  . Codeine  01/01/2011  . Lactose intolerance (gi) Other (See Comments) 09/10/2011    Family History  Problem Relation Age of Onset  . Diabetes Mother   . Diabetes Maternal Grandfather   . Heart disease Paternal Grandfather   . Heart disease Father     History   Social History  . Marital Status: Married    Spouse Name: N/A    Number of Children: N/A  . Years of Education: N/A   Occupational History  . Not on file.   Social History Main Topics  . Smoking status: Never Smoker   . Smokeless tobacco: Never Used  . Alcohol Use: No  . Drug Use: No  . Sexually Active: Yes    Birth Control/ Protection: Surgical, Post-menopausal   Other Topics Concern  . Not on file   Social History Narrative  . No narrative on file    Review of Systems: See HPI. The patient indicates she had a negative colonoscopy about 2  years ago  Physical Exam: Vital signs in last 24 hours: Temp:  [98.2 F (36.8 C)] 98.2 F (36.8 C) (06/17 1945) Pulse Rate:  [68-88] 75  (06/17 2000) Resp:  [13-20] 16  (06/17 2000) BP: (104-131)/(40-62) 130/55 mmHg (06/17 2000) SpO2:  [97 %-100 %] 99 % (06/17 2000) Weight:  [57.5 kg (126 lb 12.2 oz)] 57.5 kg (126 lb 12.2 oz) (06/17 1945) Last BM Date: 09/10/11 General:   Alert,  Well-developed, well-nourished, pleasant and cooperative in NAD Head:  Normocephalic and atraumatic. Eyes:  Sclera clear,  no icterus.    Lungs:  Clear throughout to auscultation.   No wheezes, crackles, or rhonchi. No evident respiratory distress. Heart:   Regular rate and rhythm; no murmurs, clicks, rubs,  or gallops. Abdomen:  Soft, nontender, nontympanitic, and nondistended. No masses, hepatosplenomegaly or ventral hernias noted. Normal bowel sounds, without bruits, guarding, or rebound.   Rectal: Per ER physician, was Hemoccult negative on admission Neurologic:  Alert and coherent;  grossly normal neurologically. Skin: Without overt pallor. Psych:   Alert and cooperative. Normal mood and affect.  Intake/Output from previous day:   Intake/Output this shift:    Lab Results:  Basename 09/10/11 1650 09/10/11 1310  WBC -- 7.9  HGB 8.7* 10.1*  HCT 26.6* 30.8*  PLT -- 192   BMET  Basename 09/10/11 1310  NA 140  K 4.2  CL 104  CO2 25  GLUCOSE 89  BUN 44*  CREATININE 1.04  CALCIUM 9.6   LFT  Basename 09/10/11 1310  PROT 6.3  ALBUMIN 2.8*  AST 16  ALT 9  ALKPHOS 46  BILITOT 0.4  BILIDIR --  IBILI --   PT/INR  Basename 09/10/11 1310  LABPROT 14.8  INR 1.14     Studies/Results: Ct Abdomen Pelvis Wo Contrast  09/10/2011  *RADIOLOGY REPORT*  Clinical Data: Near-syncope, hematemesis  CT ABDOMEN AND PELVIS WITHOUT CONTRAST  Technique:  Multidetector CT imaging of the abdomen and pelvis was performed following the standard protocol without intravenous contrast.  Comparison: None.   Findings: Lung bases are unremarkable.  Serpiginous high density material in the region of the right atrium may be postsurgical in nature.  Clinical correlation is necessary.  Sagittal images of the spine shows degenerative changes thoracolumbar spine.  Study is limited without IV contrast.  Unenhanced liver is unremarkable.  No calcified gallstones are noted within gallbladder.  Unenhanced pancreas, spleen and adrenal glands are unremarkable. Small amount of high density material noted within stomach.  Unenhanced kidneys are symmetrical in size.  No hydronephrosis or hydroureter.  No calcified ureteral calculi are noted bilaterally. There is a nonobstructive small calcified calculus in lower pole of the right kidney measures 2 mm.   A vascular calcification noted in the right gonadal vein.  Mild atherosclerotic calcifications are noted distal abdominal aorta to.  Vascular calcifications are noted bilateral adnexal region.  Multiple sigmoid colon diverticula are noted without evidence of acute diverticulitis.  The urinary bladder is under distended limiting its assessment.  No small bowel obstruction.  No ascites or free air.  No adenopathy.  There is no pericecal inflammation.  Normal appendix is partially visualized in axial image 62.  IMPRESSION:  1.  There is  right nonobstructive nephrolithiasis.  No hydronephrosis or hydroureter.  Bilateral no calcified ureteral calculi are noted. 2.  No small bowel obstruction. 3.  No pericecal inflammation.  Normal appendix is partially visualized. 4.  Multiple sigmoid colon diverticula are noted without evidence of acute diverticulitis.  Limited assessment of the urinary bladder which is under distended.  Original Report Authenticated By: Natasha Mead, M.D.    Impression: 1. Upper GI bleed characterized by hematemesis, presumably due to aspirin and nonsteroidal anti-inflammatory drug exposure, and enhanced in its severity by the blood thinning effect of her Plavix 2.  Posthemorrhagic anemia, moderate 3. Prior history of NSAID associated gastric ulcer with GI bleeding  Plan: Agree with Protonix infusion. Will give a single dose of sucralfate this evening. Plan endoscopy in the morning, the nature, purpose, and risks of which were  reviewed with the patient and her family who are at the bedside. Could scope her emergently tonight if she becomes unstable.   LOS: 0 days   Janete Quilling V  09/10/2011, 9:09 PM

## 2011-09-10 NOTE — ED Notes (Signed)
Pt just had hole in heart sealed last Tuesday and is on plavix and asa.  Pt has bruising to right groin.  Pt states abdomen hurting yesterday and then vomited black blood times 4 today..  Pt reports sob, heart racing and diaphorectic

## 2011-09-11 ENCOUNTER — Encounter (HOSPITAL_COMMUNITY): Payer: Self-pay

## 2011-09-11 ENCOUNTER — Encounter (HOSPITAL_COMMUNITY)
Admission: EM | Disposition: A | Discharge status: Home / Self Care | Payer: Self-pay | Source: Home / Self Care | Attending: Internal Medicine

## 2011-09-11 DIAGNOSIS — D62 Acute posthemorrhagic anemia: Secondary | ICD-10-CM

## 2011-09-11 DIAGNOSIS — R55 Syncope and collapse: Secondary | ICD-10-CM

## 2011-09-11 DIAGNOSIS — K2901 Acute gastritis with bleeding: Secondary | ICD-10-CM

## 2011-09-11 HISTORY — PX: ESOPHAGOGASTRODUODENOSCOPY: SHX5428

## 2011-09-11 LAB — CBC
HCT: 24.5 % — ABNORMAL LOW (ref 36.0–46.0)
Hemoglobin: 8 g/dL — ABNORMAL LOW (ref 12.0–15.0)
MCH: 31 pg (ref 26.0–34.0)
MCHC: 32.7 g/dL (ref 30.0–36.0)
MCV: 95 fL (ref 78.0–100.0)

## 2011-09-11 LAB — COMPREHENSIVE METABOLIC PANEL
BUN: 21 mg/dL (ref 6–23)
Calcium: 7.5 mg/dL — ABNORMAL LOW (ref 8.4–10.5)
Creatinine, Ser: 0.8 mg/dL (ref 0.50–1.10)
GFR calc Af Amer: 83 mL/min — ABNORMAL LOW (ref >90)
Glucose, Bld: 74 mg/dL (ref 70–99)
Total Protein: 5.1 g/dL — ABNORMAL LOW (ref 6.0–8.3)

## 2011-09-11 SURGERY — EGD (ESOPHAGOGASTRODUODENOSCOPY)
Anesthesia: Moderate Sedation

## 2011-09-11 MED ORDER — PANTOPRAZOLE SODIUM 40 MG PO TBEC
40.0000 mg | DELAYED_RELEASE_TABLET | Freq: Two times a day (BID) | ORAL | Status: DC
  Administered 2011-09-11 – 2011-09-12 (×2): 40 mg via ORAL
  Filled 2011-09-11 (×4): qty 1

## 2011-09-11 MED ORDER — MIDAZOLAM HCL 10 MG/2ML IJ SOLN
INTRAMUSCULAR | Status: DC | PRN
  Administered 2011-09-11: 1 mg via INTRAVENOUS
  Administered 2011-09-11: 2.5 mg via INTRAVENOUS

## 2011-09-11 MED ORDER — MIDAZOLAM HCL 10 MG/2ML IJ SOLN
INTRAMUSCULAR | Status: AC
  Filled 2011-09-11: qty 2

## 2011-09-11 MED ORDER — FENTANYL CITRATE 0.05 MG/ML IJ SOLN
INTRAMUSCULAR | Status: AC
  Filled 2011-09-11: qty 2

## 2011-09-11 MED ORDER — FENTANYL NICU IV SYRINGE 50 MCG/ML
INJECTION | INTRAMUSCULAR | Status: DC | PRN
  Administered 2011-09-11: 10 ug via INTRAVENOUS
  Administered 2011-09-11: 25 ug via INTRAVENOUS

## 2011-09-11 MED ORDER — BUTAMBEN-TETRACAINE-BENZOCAINE 2-2-14 % EX AERO
INHALATION_SPRAY | CUTANEOUS | Status: DC | PRN
  Administered 2011-09-11: 2 via TOPICAL

## 2011-09-11 NOTE — Progress Notes (Signed)
[facility]  Interim history: 71 year old female patient with history of previous brain tumor, CVAs, hypertension, GERD, recent hospitalization for PFO closure presents to the emergency department due to vomiting black colored material.   Consultants: Dr Matthias Hughs- GI   Subjective: Pt is s/p EGD. I have explained/ clarified the findings. She admits to still feeling dizzy when getting up to the commode. Passing a large amount of clear urine. Has started eating and is tolerating so far.   Objective: Blood pressure 116/50, pulse 71, temperature 98 F (36.7 C), temperature source Oral, resp. rate 15, height 5\' 3"  (1.6 m), weight 57.5 kg (126 lb 12.2 oz), last menstrual period 03/27/1983, SpO2 97.00%. Weight change:   Intake/Output Summary (Last 24 hours) at 09/11/11 1628 Last data filed at 09/11/11 1554  Gross per 24 hour  Intake   2100 ml  Output   2975 ml  Net   -875 ml    Physical Exam: General appearance: alert, cooperative and no distress Throat: lips, mucosa, and tongue normal; teeth and gums normal Lungs: clear to auscultation bilaterally Heart: regular rate and rhythm, S1, S2 normal, no murmur, click, rub or gallop Abdomen: soft, non-tender; bowel sounds normal; no masses,  no organomegaly Extremities: extremities normal, atraumatic, no cyanosis or edema Skin: Skin color, texture, turgor normal. No rashes or lesions  Lab Results:  Basename 09/11/11 0400 09/10/11 1310  NA 145 140  K 3.3* 4.2  CL 116* 104  CO2 19 25  GLUCOSE 74 89  BUN 21 44*  CREATININE 0.80 1.04  CALCIUM 7.5* 9.6  MG -- --  PHOS -- --    Basename 09/11/11 0400 09/10/11 1310  AST 14 16  ALT 7 9  ALKPHOS 35* 46  BILITOT 0.3 0.4  PROT 5.1* 6.3  ALBUMIN 2.5* 2.8*   No results found for this basename: LIPASE:2,AMYLASE:2 in the last 72 hours  Basename 09/11/11 0400 09/10/11 2233 09/10/11 1310  WBC 6.5 -- 7.9  NEUTROABS -- -- 4.9  HGB 8.0* 8.2* --  HCT 24.5* 25.1* --  MCV 95.0 --  94.8  PLT 133* -- 192    Basename 09/10/11 1311  CKTOTAL 42  CKMB 1.3  CKMBINDEX --  TROPONINI <0.30   No components found with this basename: POCBNP:3 No results found for this basename: DDIMER:2 in the last 72 hours No results found for this basename: HGBA1C:2 in the last 72 hours No results found for this basename: CHOL:2,HDL:2,LDLCALC:2,TRIG:2,CHOLHDL:2,LDLDIRECT:2 in the last 72 hours No results found for this basename: TSH,T4TOTAL,FREET3,T3FREE,THYROIDAB in the last 72 hours No results found for this basename: VITAMINB12:2,FOLATE:2,FERRITIN:2,TIBC:2,IRON:2,RETICCTPCT:2 in the last 72 hours  Micro Results: Recent Results (from the past 240 hour(s))  MRSA PCR SCREENING     Status: Normal   Collection Time   09/10/11  7:50 PM      Component Value Range Status Comment   MRSA by PCR NEGATIVE  NEGATIVE Final     Studies/Results: Ct Abdomen Pelvis Wo Contrast  09/10/2011  *RADIOLOGY REPORT*  Clinical Data: Near-syncope, hematemesis  CT ABDOMEN AND PELVIS WITHOUT CONTRAST  Technique:  Multidetector CT imaging of the abdomen and pelvis was performed following the standard protocol without intravenous contrast.  Comparison: None.  Findings: Lung bases are unremarkable.  Serpiginous high density material in the region of the right atrium may be postsurgical in nature.  Clinical correlation is necessary.  Sagittal images of the spine shows degenerative changes thoracolumbar spine.  Study is limited without IV contrast.  Unenhanced liver is unremarkable.  No  calcified gallstones are noted within gallbladder.  Unenhanced pancreas, spleen and adrenal glands are unremarkable. Small amount of high density material noted within stomach.  Unenhanced kidneys are symmetrical in size.  No hydronephrosis or hydroureter.  No calcified ureteral calculi are noted bilaterally. There is a nonobstructive small calcified calculus in lower pole of the right kidney measures 2 mm.   A vascular calcification noted in  the right gonadal vein.  Mild atherosclerotic calcifications are noted distal abdominal aorta to.  Vascular calcifications are noted bilateral adnexal region.  Multiple sigmoid colon diverticula are noted without evidence of acute diverticulitis.  The urinary bladder is under distended limiting its assessment.  No small bowel obstruction.  No ascites or free air.  No adenopathy.  There is no pericecal inflammation.  Normal appendix is partially visualized in axial image 62.  IMPRESSION:  1.  There is  right nonobstructive nephrolithiasis.  No hydronephrosis or hydroureter.  Bilateral no calcified ureteral calculi are noted. 2.  No small bowel obstruction. 3.  No pericecal inflammation.  Normal appendix is partially visualized. 4.  Multiple sigmoid colon diverticula are noted without evidence of acute diverticulitis.  Limited assessment of the urinary bladder which is under distended.  Original Report Authenticated By: Natasha Mead, M.D.    Medications: Scheduled Meds:   . pantoprazole  40 mg Oral BID AC  . sodium chloride  3 mL Intravenous Q12H  . sucralfate  1 g Oral Once  . DISCONTD: sodium chloride   Intravenous STAT   Continuous Infusions:   . sodium chloride    . DISCONTD: sodium chloride 1,000 mL (09/10/11 1623)  . DISCONTD: pantoprozole (PROTONIX) infusion 8 mg/hr (09/11/11 0343)   PRN Meds:.albuterol, diphenhydrAMINE, ondansetron (ZOFRAN) IV, ondansetron, DISCONTD: butamben-tetracaine-benzocaine, DISCONTD: fentaNYL, DISCONTD: midazolam, DISCONTD: ondansetron (ZOFRAN) IV  Assessment/Plan: Principal Problem:  *Acute upper GI bleeding S/p EGD- Mallory Weiss Tear noted- no gastric ulcer noted but Dr Matthias Hughs recommends Protonix for 1 month.  Spoke with Dr Jacinto Halim who evaluated her this AM and recommends a baby ASA daily to be swallow (not chewed) and he will follow up in 2 months.   Anemia due to acute blood loss/ Near syncope Cont to follow, if dizziness does not resolve, may need a unit of  blood tomorrow. Last Hgb in march was 12.    History of stroke and PFO (patent foramen ovale) s/p repair by Dr Jacinto Halim See above regarding anticoagulation  Hypotension - follow Cutting fluids back to 75 cc/hr from 150 cc/hr.   Code Status: full code Family Communication: with husband Disposition:To home in AM if Hgb stable and ambulating without dizziness. F/u on iron studies in AM    [facility] [phone] 09/11/2011, 4:28 PM  LOS: 1 day

## 2011-09-11 NOTE — Progress Notes (Signed)
Although not mentioned in my note from earlier today, I have spoken with Dr. Waymon Amato regarding my recommendations. Please see my note from earlier today, but  additional recommendations are as follows:  1. I think discharge tomorrow is appropriate if the patient remains stable overnight, as I anticipate she will 2. I think it would be safe to restart her aspirin and Plavix tomorrow 3. I would favor one month of PPI therapy to be on the safe side in case there is some undetected peptic disease in the antral region  Florencia Reasons, M.D. [phone]

## 2011-09-11 NOTE — Op Note (Signed)
Documentation done in endoPro; see under Procedures tab.   Ruslan Mccabe V. Maurianna Benard, M.D. [phone]   

## 2011-09-11 NOTE — Op Note (Signed)
[facility] [address] [place], [place]  [address]  ENDOSCOPY PROCEDURE REPORT  PATIENT:  [name], [name]  MR#:  [id number] BIRTHDATE:  05/01/1940, 70 yrs. old  GENDER:  female  ENDOSCOPIST:  Bernette Redbird, MD Referred by:  Ancil Boozer, M.D. Delrae Rend, M.D.  PROCEDURE DATE:  09/11/2011 PROCEDURE:  EGD, diagnostic 43235 ASA CLASS: INDICATIONS:  71 year old female, one-week status post percutaneous repair of a patent foramen ovale, on aspirin and Plavix since that time, with 4 episodes of hematemesis, presyncope, and 4 g drop in hemoglobin yesterday  MEDICATIONS:   Fentanyl 35 mcg IV, Versed 3.5 mg IV TOPICAL ANESTHETIC:  Cetacaine Spray  DESCRIPTION OF PROCEDURE:   After the risks and benefits of the procedure were explained, informed consent was obtained.  The Pentax Gastroscope [id number] endoscope was introduced through the mouth and advanced to the second portion of the duodenum.  The instrument was slowly withdrawn as the mucosa was fully examined. <<PROCEDUREIMAGES>> The scope was passed under direct vision. The esophagus was entered without difficulty. It was normal except for a small hiatal hernia and evidence for a resolving Mallory-Weiss tear at the squamocolumnar junction. There was a widely patent esophageal ring. A 2 cm hiatal hernia was present.  The stomach was entered. It contained no blood or coffee-ground material. There were some edematous folds in the prepyloric area, a careful inspection in the crevices of these folds did not disclose any active ulcer disease. The remainder of the stomach was normal, including retroflex view of the cardia, which showed the small hiatal hernia from its inferior perspective.  The pyloric channel was a little bit edematous, but there was no stenosis. The duodenal bulb and second duodenum looked normal.  No biopsies were obtained. No interventions were performed. The patient tolerated the  procedure very smooth.  COMPLICATIONS:  None  ENDOSCOPIC IMPRESSION: 1. No active bleeding or blood in the stomach at the time this exam. 2. Mucosal defect consistent with a resolving Mallory-Weiss tear at the squamocolumnar junction. This is the presumed source of the patient's hematemesis yesterday. 3. Possible inflammation in the prepyloric region of the antrum, without any ulcer being identified in that region  RECOMMENDATIONS: 1. The patient appears to be at low risk for clinically significant rebleeding, and can be moved out of the step down unit. Her diet can be advanced. She may be transitioned to oral PPI therapy. I think that resumption of aspirin and Plavix in a day or 2 would be reasonably safe. 2. No followup endoscopy is needed. 3. To be on the safe side, in case there was some hidden ulcer disease in the antral region, I would recommend once daily PPI therapy (not omeprazole or Nexium, in view of Plavix) for one month  ______________________________ Bernette Redbird, MD  CC:  n. [email or web address] Maduro Elisea Khader at 09/11/2011 11:24 AM  [place], Harriett Sine, [phone]

## 2011-09-11 NOTE — Interval H&P Note (Signed)
History and Physical Interval Note:  09/11/2011 10:52 AM  [name]  has presented today for endoscopy, with the diagnosis of Upper GI bleed  The various methods of treatment have been discussed with the patient and family. After consideration of risks, benefits and other options for treatment, the patient has consented to  Procedure(s) (LRB): ESOPHAGOGASTRODUODENOSCOPY (EGD) (N/A) as a surgical intervention .  The patient's history has been reviewed, patient examined, no change in status, stable for surgery.  No further hematemesis overnight, hgb has levelled off at 8.0I have reviewed the patients' chart and labs.  Questions were answered to the patient's satisfaction.     Florencia Reasons

## 2011-09-11 NOTE — Progress Notes (Signed)
The patient was stable overnight, without further she coffee-ground emesis, and her hemoglobin has leveled off at 8.0, without transfusion.  Her endoscopy this morning was well tolerated, and was pertinent only for what appeared to be a resolving Mallory-Weiss tear. It turns out that the patient had been coughing quite a bit prior to the onset of the coffee-ground emesis, so this is probably had a Mallory-Weiss tear occurred.  The patient appears to be a very low risk for rebleeding. I feel she can be moved out of step down unit. I've written to advance her diet and convert her PPI therapy to oral medication.  Please see the EGD procedure report for further recommendations regarding this patient's disposition and management, and feel free to call me if any questions.  Florencia Reasons, M.D. [phone]

## 2011-09-12 ENCOUNTER — Encounter (HOSPITAL_COMMUNITY): Payer: Self-pay | Admitting: Gastroenterology

## 2011-09-12 DIAGNOSIS — R55 Syncope and collapse: Secondary | ICD-10-CM

## 2011-09-12 DIAGNOSIS — D62 Acute posthemorrhagic anemia: Secondary | ICD-10-CM

## 2011-09-12 DIAGNOSIS — K2901 Acute gastritis with bleeding: Secondary | ICD-10-CM

## 2011-09-12 LAB — CBC
HCT: 23.5 % — ABNORMAL LOW (ref 36.0–46.0)
Hemoglobin: 7.7 g/dL — ABNORMAL LOW (ref 12.0–15.0)
MCHC: 32.8 g/dL (ref 30.0–36.0)
MCV: 94 fL (ref 78.0–100.0)
RDW: 14.5 % (ref 11.5–15.5)

## 2011-09-12 LAB — IRON AND TIBC
Iron: 59 ug/dL (ref 42–135)
UIBC: 150 ug/dL (ref 125–400)

## 2011-09-12 MED ORDER — PANTOPRAZOLE SODIUM 40 MG PO TBEC: 40.0000 mg | DELAYED_RELEASE_TABLET | Freq: Two times a day (BID) | ORAL | Status: DC

## 2011-09-12 NOTE — Progress Notes (Signed)
Dr. Dolphus Jenny paged. Waiting anxiously to be discharged home.

## 2011-09-12 NOTE — Progress Notes (Signed)
Hemoglobin essentially stable, 8.0-7.7. No bowel movements. No hematemesis. Tolerating solid diet.  Impression: Resolving Mallory-Weiss tear with stable posthemorrhagic anemia.  Recommendation: Okay for discharge from the GI tract standpoint. No GI followup necessary. Would recommend followup with the patient's primary physician in the next week or so to recheck a hemoglobin, at which time iron supplementation might be started to facilitate normalization of her hemoglobin level.  Will sign off at this time. Call if I can be of further assistance.  Florencia Reasons, M.D. [phone]

## 2011-09-12 NOTE — Discharge Summary (Signed)
DISCHARGE SUMMARY  [name]  MR#: [id number]  DOB:[birth year]  Date of Admission: 09/10/2011 Date of Discharge: 09/12/2011  Attending Physician:Ilian Wessell T  Patient's PCP:ALM,STEPHANIE, MD  Consults: Florencia Reasons, MD - [facility]  Disposition: D/C home with husband  Follow-up Appts:  Follow-up Information    Follow up with ALM,STEPHANIE, MD. (call to schedule a follow up appointment to have your blood count checked (CBC) in 7-10 days )    Contact information:   [address] [place] [address] [phone]    Discharge Orders    Future Orders Please Complete By Expires   Increase activity slowly        Tests Needing Follow-up: CBC should be drawn to recheck Hgb - Hgb 7.7 on day of D/C   Discharge Diagnoses: Acute upper GI bleeding/Mallory Janann August Tear Acute posthemorrhagic anemia Near syncope Hypotension / hypovolemia  Initial presentation: 72 year old female patient with history of previous brain tumor, CVAs, hypertension, GERD, recent hospitalization for PFO closure presented to the emergency department due to vomiting black colored material. Since her discharge from the hospital on 09/05/11, she had generally been weak with poor appetite. She was newly started on aspirin 324 mg daily in addition to previous Plavix 75 mg daily at that time. In addition she had been taking 2 tablets of Advil PM nightly for arthritic pains. Between 9:15 - 9:30 AM the day of her admit she had 4 episodes of vomiting black colored material. There was no bright red blood. She denied abdominal pain. Last BM on 6/16 was normal colored. She felt dizzy like she was nearly going to pass out, but did not. She then went and laid down on a reclining chair and called her primary care physician's office and was advised to come to the emergency department.  Hospital Course:  Acute upper GI bleeding  S/p EGD 09/11/2011 - Mallory Weiss Tear noted - no gastric ulcer noted  but Dr Matthias Hughs recommends Protonix for 1 month - spoke with Dr Jacinto Halim who evaluated her and recommends a baby ASA daily to be swallow (not chewed) and he will follow up in 2 months time  Anemia due to acute blood loss/ Near syncope  Baseline Hgb in March was 12 - with volume resuscitation, her orthostatic symptoms have improved significantly - there is no evidence of ongoing blood loss - Hgb has reached a nadir of 7.7, but this is after hydration/dilution (evidenced by decreased BUN and climbing Cl) - pt is anxious to be d/c home - I feel she is safe for d/c, but have warned her extensively as to the signs/symptoms to watch for - she has agreed to present immediately to the ER if she develops evidence of ongoing bleeding or presyncope  History of stroke and PFO (patent foramen ovale) s/p repair by Dr Jacinto Halim  See above regarding anticoagulation   Hypotension Due to hypvolemia due to acute blood loss - resolved w/ volume resuscitation - transfusion not required - BP normalized at time of D/C   Medication List  As of 09/12/2011  2:36 PM   STOP taking these medications         clopidogrel 75 MG tablet      IBUPROFEN PM PO         TAKE these medications         aspirin EC 81 MG tablet   Take 324 mg by mouth daily.      calcium citrate-vitamin D 315-200 MG-UNIT per tablet  Commonly known as: CITRACAL+D   Take 2 tablets by mouth daily.      DHEA 25 MG Caps   Take 1 capsule by mouth daily.      fish oil-omega-3 fatty acids 1000 MG capsule   Take 1 g by mouth daily.      multivitamin with minerals Tabs   Take 1 tablet by mouth daily.      OVER THE COUNTER MEDICATION   Take 18 drops by mouth daily. ETDA- PUTS IN DRINK      pantoprazole 40 MG tablet   Commonly known as: PROTONIX   Take 1 tablet (40 mg total) by mouth 2 (two) times daily before a meal.      VAGIFEM VA   Place 1 application vaginally every Monday, Wednesday, and Friday.      vitamin B-12 500 MCG tablet   Commonly  known as: CYANOCOBALAMIN   Take 500 mcg by mouth daily.           Day of Discharge BP 105/48  Pulse 71  Temp 98.3 F (36.8 C) (Oral)  Resp 17  Ht 5\' 3"  (1.6 m)  Wt 62.2 kg (137 lb 2 oz)  BMI 24.29 kg/m2  SpO2 100%  LMP 03/27/1983  Physical Exam: General: No acute respiratory distress Lungs: Clear to auscultation bilaterally without wheezes or crackles Cardiovascular: Regular rate and rhythm without murmur gallop or rub normal S1 and S2 Abdomen: Nontender, nondistended, soft, bowel sounds positive, no rebound, no ascites, no appreciable mass Extremities: No significant cyanosis, clubbing, or edema bilateral lower extremities  Results for orders placed during the hospital encounter of 09/10/11 (from the past 24 hour(s))  IRON AND TIBC     Status: Abnormal   Collection Time   09/11/11  8:08 PM      Component Value Range   Iron 59  42 - 135 ug/dL   TIBC 811 (*) 914 - 782 ug/dL   Saturation Ratios 28  20 - 55 %   UIBC 150  125 - 400 ug/dL  CBC     Status: Abnormal   Collection Time   09/12/11  4:30 AM      Component Value Range   WBC 6.4  4.0 - 10.5 K/uL   RBC 2.50 (*) 3.87 - 5.11 MIL/uL   Hemoglobin 7.7 (*) 12.0 - 15.0 g/dL   HCT 95.6 (*) 21.3 - 08.6 %   MCV 94.0  78.0 - 100.0 fL   MCH 30.8  26.0 - 34.0 pg   MCHC 32.8  30.0 - 36.0 g/dL   RDW 57.8  46.9 - 62.9 %   Platelets 131 (*) 150 - 400 K/uL  FERRITIN     Status: Normal   Collection Time   09/12/11  4:30 AM      Component Value Range   Ferritin 64  10 - 291 ng/mL   Time spent in discharge (includes decision making & examination of pt): 30 minutes  09/12/2011, 2:36 PM   Lonia Blood, MD [facility] Office  [phone] Pager [phone]  On-Call/Text Page:      [email or web address]      password [email or web address]    /c

## 2011-09-12 NOTE — Discharge Instructions (Signed)
Mallory-Weiss Syndrome  Mallory-Weiss syndrome refers to bleeding from tears in the lining of the esophagus near where it meets the stomach. This is often caused by forceful vomiting, retching or coughing. This condition is often associated with alcoholism. Usually the bleeding stops by itself after 24 to 48 hours. Sometimes endoscopic or surgical treatment is needed. This condition is not usually fatal. SYMPTOMS  Vomiting of bright red or black coffee ground like material.   Black, tarry stools.   Low blood pressure causing you to feel faint or experience loss of consciousness.  DIAGNOSIS  Definitive diagnosis is by endoscopy. Treatment is usually supportive. Persistent bleeding is uncommon. Sometimes cauterization or injection of epinephrine to stop the bleeding is used during the diagnostic endoscopy. Embolization (obstruction) of the arteries supplying the area of bleeding is sometimes used to stop the bleeding.  An NG tube (naso-gastric tube) may be inserted to determine where the bleeding is coming from.   Often an EGD (esophagogastroduodenoscopy) is done. In this procedure there is a small flexible tube-like telescope (endoscope) put into your mouth, through your esophagus (the food tube leading from your mouth to your stomach), down into your stomach and into the small bowel. Through this your caregiver can see what and where the problem is.  TREATMENT  It is necessary to stop the bleeding as soon as possible. During the EGD, your caregiver may inject medication into bleeding vessels to clot them.  SEEK IMMEDIATE MEDICAL CARE IF:  You have persistent dizziness, lightheadedness, or fainting.   Your vomiting returns and you have blood in your stools.   You have vomit that is bright red blood or black coffee ground-like blood, bright red blood in the stool or black tarry stools.   You have chest pain.   You cannot eat or drink.   You have nausea or vomiting.  MAKE SURE YOU:    Understand these instructions.   Will watch your condition.   Will get help right away if you are not doing well or get worse.  Document Released: 07/30/2005 Document Revised: 03/01/2011 Document Reviewed: 09/03/2005 [facility] Patient Information 2012 [place], [place].  Gastrointestinal Bleeding Bleeding in the gastrointestinal tract comes out when you throw up (vomit) or poop. Treatment will depend on how fast the blood is flowing, where it is coming from, and the cause. A small amount of bleeding that stops on its own may not need treatment.  HOME CARE  Do not drink alcohol.   Do not eat things that upset your stomach or give you heartburn.   Rest and limit your activity.   Do not smoke. Smoking may make your problems worse.   Wash your hands or use sanitizer every time you use the bathroom. Some bleeding is caused by germs.   Only take medicine as told by your doctor.  GET HELP RIGHT AWAY IF:   Your throw up looks like coffee grounds or is dark or bright red.   Your poop is black or tarry. You see blood in the toilet.   You feel weak, dizzy, and short of breath.   You breathe fast and have a fast heartbeat.   You have bad stomach pain or cramping.  MAKE SURE YOU:   Understand these instructions.   Will watch your condition.   Will get help right away if you are not doing well or get worse.  Document Released: 12/20/2007 Document Revised: 03/01/2011 Document Reviewed: 02/19/2011 [facility] Patient Information 2012 [place], [place].

## 2011-09-14 LAB — TYPE AND SCREEN
ABO/RH(D): O POS
Antibody Screen: NEGATIVE

## 2011-10-10 ENCOUNTER — Encounter (HOSPITAL_COMMUNITY): Payer: Self-pay | Admitting: Emergency Medicine

## 2011-10-10 ENCOUNTER — Emergency Department (HOSPITAL_COMMUNITY): Payer: Medicare Other

## 2011-10-10 ENCOUNTER — Emergency Department (HOSPITAL_COMMUNITY)
Admission: EM | Admit: 2011-10-10 | Discharge: 2011-10-10 | Disposition: A | Discharge status: Home / Self Care | Payer: Medicare Other | Attending: Emergency Medicine | Admitting: Emergency Medicine

## 2011-10-10 DIAGNOSIS — K219 Gastro-esophageal reflux disease without esophagitis: Secondary | ICD-10-CM | POA: Insufficient documentation

## 2011-10-10 DIAGNOSIS — K5732 Diverticulitis of large intestine without perforation or abscess without bleeding: Secondary | ICD-10-CM | POA: Insufficient documentation

## 2011-10-10 DIAGNOSIS — Z79899 Other long term (current) drug therapy: Secondary | ICD-10-CM | POA: Insufficient documentation

## 2011-10-10 DIAGNOSIS — Z8673 Personal history of transient ischemic attack (TIA), and cerebral infarction without residual deficits: Secondary | ICD-10-CM | POA: Insufficient documentation

## 2011-10-10 DIAGNOSIS — F329 Major depressive disorder, single episode, unspecified: Secondary | ICD-10-CM | POA: Insufficient documentation

## 2011-10-10 DIAGNOSIS — Z7982 Long term (current) use of aspirin: Secondary | ICD-10-CM | POA: Insufficient documentation

## 2011-10-10 DIAGNOSIS — I10 Essential (primary) hypertension: Secondary | ICD-10-CM | POA: Insufficient documentation

## 2011-10-10 DIAGNOSIS — F3289 Other specified depressive episodes: Secondary | ICD-10-CM | POA: Insufficient documentation

## 2011-10-10 DIAGNOSIS — K5792 Diverticulitis of intestine, part unspecified, without perforation or abscess without bleeding: Secondary | ICD-10-CM

## 2011-10-10 DIAGNOSIS — M810 Age-related osteoporosis without current pathological fracture: Secondary | ICD-10-CM | POA: Insufficient documentation

## 2011-10-10 DIAGNOSIS — IMO0001 Reserved for inherently not codable concepts without codable children: Secondary | ICD-10-CM | POA: Insufficient documentation

## 2011-10-10 LAB — BASIC METABOLIC PANEL
BUN: 16 mg/dL (ref 6–23)
CO2: 29 mEq/L (ref 19–32)
Chloride: 102 mEq/L (ref 96–112)
Creatinine, Ser: 1.12 mg/dL — ABNORMAL HIGH (ref 0.50–1.10)
GFR calc Af Amer: 55 mL/min — ABNORMAL LOW (ref >90)
Potassium: 3.5 mEq/L (ref 3.5–5.1)

## 2011-10-10 LAB — CBC
HCT: 34.3 % — ABNORMAL LOW (ref 36.0–46.0)
MCV: 93 fL (ref 78.0–100.0)
Platelets: 197 10*3/uL (ref 150–400)
RBC: 3.69 MIL/uL — ABNORMAL LOW (ref 3.87–5.11)
RDW: 14.2 % (ref 11.5–15.5)
WBC: 9.2 10*3/uL (ref 4.0–10.5)

## 2011-10-10 MED ORDER — METRONIDAZOLE IN NACL 5-0.79 MG/ML-% IV SOLN
500.0000 mg | Freq: Once | INTRAVENOUS | Status: AC
  Administered 2011-10-10: 500 mg via INTRAVENOUS
  Filled 2011-10-10: qty 100

## 2011-10-10 MED ORDER — MORPHINE SULFATE 4 MG/ML IJ SOLN
6.0000 mg | Freq: Once | INTRAMUSCULAR | Status: AC
  Administered 2011-10-10: 6 mg via INTRAVENOUS
  Filled 2011-10-10 (×2): qty 2

## 2011-10-10 MED ORDER — IOHEXOL 300 MG/ML  SOLN
80.0000 mL | Freq: Once | INTRAMUSCULAR | Status: AC | PRN
  Administered 2011-10-10: 80 mL via INTRAVENOUS

## 2011-10-10 MED ORDER — SODIUM CHLORIDE 0.9 % IV BOLUS (SEPSIS)
1000.0000 mL | Freq: Once | INTRAVENOUS | Status: AC
  Administered 2011-10-10: 1000 mL via INTRAVENOUS

## 2011-10-10 MED ORDER — SODIUM CHLORIDE 0.9 % IV SOLN
Freq: Once | INTRAVENOUS | Status: AC
  Administered 2011-10-10: 125 mL/h via INTRAVENOUS

## 2011-10-10 MED ORDER — METRONIDAZOLE 500 MG PO TABS: 500.0000 mg | ORAL_TABLET | Freq: Two times a day (BID) | ORAL | Status: AC

## 2011-10-10 MED ORDER — IOHEXOL 300 MG/ML  SOLN
20.0000 mL | INTRAMUSCULAR | Status: AC
  Administered 2011-10-10: 20 mL via ORAL

## 2011-10-10 MED ORDER — CIPROFLOXACIN HCL 500 MG PO TABS: 500.0000 mg | ORAL_TABLET | Freq: Two times a day (BID) | ORAL | Status: AC

## 2011-10-10 MED ORDER — CIPROFLOXACIN IN D5W 400 MG/200ML IV SOLN
400.0000 mg | Freq: Once | INTRAVENOUS | Status: AC
  Administered 2011-10-10: 400 mg via INTRAVENOUS
  Filled 2011-10-10: qty 200

## 2011-10-10 MED ORDER — SODIUM CHLORIDE 0.9 % IV BOLUS (SEPSIS)
500.0000 mL | Freq: Once | INTRAVENOUS | Status: AC
  Administered 2011-10-10: 500 mL via INTRAVENOUS

## 2011-10-10 NOTE — ED Notes (Addendum)
IV team returned call - they are on the way to ED.

## 2011-10-10 NOTE — ED Notes (Signed)
Patient is resting comfortably. 

## 2011-10-10 NOTE — ED Notes (Signed)
Patient resting quietly, calm with unlabored respirations.  Patient waiting for CT scan.  Ct has been notified that patient has completed her contrast.  Report called and care transferred to [facility] in CDU.  Patient will be returned to CDU after CT complete.

## 2011-10-10 NOTE — ED Provider Notes (Signed)
History     CSN: [id number]  Arrival date & time 10/10/11  2841   First MD Initiated Contact with Patient 10/10/11 0719      Chief Complaint  Patient presents with  . Abdominal Pain    (Consider location/radiation/quality/duration/timing/severity/associated sxs/prior treatment) The history is provided by the patient.   the patient reports developing pain in her lower abdomen that began last night and has been constant and worsening since then.  She's had no nausea or vomiting.  She's had no diarrhea.  She thought she may have been constipated and tried over-the-counter MiraLAX and her pain did not resolve after bowel movements.  She has some discomfort with urination but no frequency.  She denies fevers and chills.  She's never had abdominal pain like this before.  She still has her appendix.  She does not have a history of diverticulosis as far she knows.  She's never had diverticulitis before.  She denies upper abdominal pain.  She has no chest pain or shortness of breath.  She's had no blood in her stool.  Her pain is mild to moderate at this time.  Nothing improves her pain.  Her pain is worsened by movement and palpation.  Past Medical History  Diagnosis Date  . CIN I (cervical intraepithelial neoplasia I)   . Osteoporosis   . Stroke   . Hemorrhoids   . Brain tumor   . Hypertension   . Blood transfusion     " no reaction to transfusion "  . GERD (gastroesophageal reflux disease)   . Arthritis   . Kidney stone   . Depression   . Fibromyalgia   . PFO (patent foramen ovale)     Past Surgical History  Procedure Date  . Tubal ligation   . Kidney stone surgery   . Brain biopsy   . Stomach surgery   . Colposcopy   . Breast enhancement surgery   . Tee without cardioversion 06/06/2011    Procedure: TRANSESOPHAGEAL ECHOCARDIOGRAM (TEE);  Surgeon: Pamella Pert, MD;  Location: [facility] ENDOSCOPY;  Service: Cardiovascular;  Laterality: N/A;  . Patent foramen ovale closure  09/04/2011  . Esophagogastroduodenoscopy 09/11/2011    Procedure: ESOPHAGOGASTRODUODENOSCOPY (EGD);  Surgeon: Florencia Reasons, MD;  Location: [facility] ENDOSCOPY;  Service: Endoscopy;  Laterality: N/A;  slight preference for doing bedside procedure    Family History  Problem Relation Age of Onset  . Diabetes Mother   . Diabetes Maternal Grandfather   . Heart disease Paternal Grandfather   . Heart disease Father     History  Substance Use Topics  . Smoking status: Never Smoker   . Smokeless tobacco: Never Used  . Alcohol Use: No    OB History    Grav Para Term Preterm Abortions TAB SAB Ect Mult Living   4 3 3  1  1   3       Review of Systems  Gastrointestinal: Positive for abdominal pain.  All other systems reviewed and are negative.    Allergies  Codeine and Lactose intolerance (gi)  Home Medications   Current Outpatient Rx  Name Route Sig Dispense Refill  . ACETAMINOPHEN 500 MG PO TABS Oral Take 1,000 mg by mouth every evening.    . ASPIRIN EC 81 MG PO TBEC Oral Take 81 mg by mouth daily.     Marland Kitchen CALCIUM CITRATE-VITAMIN D 315-200 MG-UNIT PO TABS Oral Take 2 tablets by mouth daily.     . OMEGA-3 FATTY ACIDS 1000 MG PO CAPS  Oral Take 1 g by mouth daily.    . ADULT MULTIVITAMIN W/MINERALS CH Oral Take 1 tablet by mouth daily.    Marland Kitchen MELATONIN PO Oral Take 1 tablet by mouth at bedtime.    Marland Kitchen PANTOPRAZOLE SODIUM 40 MG PO TBEC Oral Take 40 mg by mouth every morning.      BP 144/59  Pulse 76  Temp 98 F (36.7 C) (Oral)  Resp 18  Ht 5\' 3"  (1.6 m)  SpO2 98%  LMP 03/27/1983  Physical Exam  Nursing note and vitals reviewed. Constitutional: She is oriented to person, place, and time. She appears well-developed and well-nourished. No distress.  HENT:  Head: Normocephalic and atraumatic.  Eyes: EOM are normal.  Neck: Normal range of motion.  Cardiovascular: Normal rate, regular rhythm and normal heart sounds.   Pulmonary/Chest: Effort normal and breath sounds normal.    Abdominal: Soft. She exhibits no distension.       Tenderness in the bilateral lower quadrants left greater than right.  Mild guarding in the left lower quadrant.  No peritonitis on exam  Musculoskeletal: Normal range of motion.  Neurological: She is alert and oriented to person, place, and time.  Skin: Skin is warm and dry.  Psychiatric: She has a normal mood and affect. Judgment normal.    ED Course  Procedures (including critical care time)  Labs Reviewed  CBC - Abnormal; Notable for the following:    RBC 3.69 (*)     Hemoglobin 11.2 (*)     HCT 34.3 (*)     All other components within normal limits  BASIC METABOLIC PANEL   No results found.   No diagnosis found.    MDM  I suspect this is diverticulitis however appendicitis is still a possibility.  The patient will obtain a CT abdomen and pelvis to further evaluate.  Labs pending.  Urine pending.  Pain treated at this time.  N.p.o.  Patient to have her care continued in the clinical decision unit        Lyanne Co, MD 10/10/11 [phone]

## 2011-10-10 NOTE — ED Notes (Signed)
Patient ambulated to restroom. Patient tolerated well.

## 2011-10-10 NOTE — ED Notes (Signed)
Discharged home with written and verbal instructions.  No questions or concern at discharge 

## 2011-10-10 NOTE — ED Notes (Signed)
IV access attempt unsuccessful x 3.  IV team called to bedside for access.

## 2011-10-10 NOTE — ED Provider Notes (Signed)
I was the primary provider of this patient during this ER visit. The patients care was continued in the CDU and managed in conjunction with my midlevel providers   Lyanne Co, MD 10/10/11 2130

## 2011-10-10 NOTE — ED Notes (Signed)
Patient claims she started having pain throughout abdomen last night.  Patient claims felt constipated, but took enema.  Did get relief from constipation, but still had pain.

## 2011-10-10 NOTE — ED Provider Notes (Signed)
Patient with 1 day of lower abdominal pain. CT showing diverticulitis. No appendicitis or abscess. Patient is laying comfortably in bed in NAD. Pain level minimal to none. Positive ttp of LLQ and RLQ on exam. No rebound tenderness or guarding. Start IV abx and monitor.  2:21 PM Patient completed IV abx. Resting comfortably in bed in NAD. Decreased TTP of LLQ and RLQ. Denies any nausea. Comfortable with discharge. Discussed low fiber bland diet.   Trevor Mace, PA-C 10/10/11 1425

## 2012-01-07 ENCOUNTER — Telehealth: Payer: Self-pay | Admitting: *Deleted

## 2012-01-07 NOTE — Telephone Encounter (Signed)
The date of the result note should be 2012. She should keep appointment for  annual on 01/10/12.

## 2012-01-07 NOTE — Telephone Encounter (Signed)
If I understood your message  correctly her appointment with me is before the DEXA and we will decide on that day.

## 2012-01-07 NOTE — Telephone Encounter (Signed)
Pt never came to discuss Dexa report as directed on 01/31/12 result note. Pt has scheduled dexa for this year on 01/31/12. Would you like her keep appointment? Pt has annual scheduled on 01/10/12 Please advise

## 2012-01-07 NOTE — Telephone Encounter (Signed)
Pt will be informed. 

## 2012-01-07 NOTE — Telephone Encounter (Signed)
Yes it should be 2012. Should pt keep dexa appointment 01/31/12?

## 2012-01-10 ENCOUNTER — Other Ambulatory Visit (HOSPITAL_COMMUNITY)
Admission: RE | Admit: 2012-01-10 | Discharge: 2012-01-10 | Disposition: A | Discharge status: Home / Self Care | Payer: Medicare Other | Source: Ambulatory Visit | Attending: Obstetrics and Gynecology | Admitting: Obstetrics and Gynecology

## 2012-01-10 ENCOUNTER — Encounter: Payer: Self-pay | Admitting: Obstetrics and Gynecology

## 2012-01-10 ENCOUNTER — Ambulatory Visit (INDEPENDENT_AMBULATORY_CARE_PROVIDER_SITE_OTHER): Payer: Medicare Other | Admitting: Obstetrics and Gynecology

## 2012-01-10 VITALS — BP 122/72 | Ht 64.0 in | Wt 124.0 lb

## 2012-01-10 DIAGNOSIS — Z01419 Encounter for gynecological examination (general) (routine) without abnormal findings: Secondary | ICD-10-CM | POA: Insufficient documentation

## 2012-01-10 DIAGNOSIS — Z124 Encounter for screening for malignant neoplasm of cervix: Secondary | ICD-10-CM

## 2012-01-10 DIAGNOSIS — K5792 Diverticulitis of intestine, part unspecified, without perforation or abscess without bleeding: Secondary | ICD-10-CM | POA: Insufficient documentation

## 2012-01-10 DIAGNOSIS — N870 Mild cervical dysplasia: Secondary | ICD-10-CM

## 2012-01-10 DIAGNOSIS — M810 Age-related osteoporosis without current pathological fracture: Secondary | ICD-10-CM

## 2012-01-10 DIAGNOSIS — N952 Postmenopausal atrophic vaginitis: Secondary | ICD-10-CM

## 2012-01-10 MED ORDER — ESTRADIOL 10 MCG VA TABS: ORAL_TABLET | VAGINAL | Status: DC

## 2012-01-10 MED ORDER — ALENDRONATE SODIUM 70 MG PO TABS: 70.0000 mg | ORAL_TABLET | ORAL | Status: DC

## 2012-01-10 NOTE — Progress Notes (Signed)
Patient came back to see me today for further follow up. Last year we did her bone density and  it showed osteoporosis. We had  asked her to return But she did not because of multiple medical problems. First she had a stroke from a clot. She was then diagnosed with a patent foramen ovale and had it closed by the cardiologist. She now only requires a baby aspirin. She had  previously been treated with Fosamax. A note in her chart previously said she had side effects from it but she does not remember any. 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 She has not had any fractures. She does have bad  Osteoarthritis. She has never been treated for cervical dysplasia. However in 2000 she had a Pap smear showing CIN-1. Colposcopy with biopsy showed cervical cervicitis with reactive squamous changes. Paps since then have been normal. Her last Pap was 2011.She has symptomatic atrophic vaginitis. She uses Vagifem 3 times a week with excellent results. She is having no vaginal bleeding. She is not having dyspareunia or pelvic pain.  ROS: 12 system review done. Pertinent positives above. Other positives include diverticulitis.She also has a history of a previous brain tumor.  Physical examination:Kim Julian Reil present. HEENT within normal limits. Neck: Thyroid not large. No masses. Supraclavicular nodes: not enlarged. Breasts: Examined in both sitting and lying  position. No skin changes and no masses. Abdomen: Soft no guarding rebound or masses or hernia. Pelvic: External: Within normal limits. BUS: Within normal limits. Vaginal:within normal limits. Good estrogen effect. No evidence of cystocele rectocele or enterocele. Cervix: clean. Uterus: Normal size and shape. Adnexa: No masses. Rectovaginal exam: Confirmatory and negative. Extremities: Within normal limits.  Assessment: #1. Atrophic vaginitis #2. Previous abnormal Pap smear-possible low-grade cervical dysplasia. #3. Osteoporosis  Plan:  Continue Vagifem. Her other physicians are aware that she is on it. Pap done. We will try Fosamax for her osteoporosis. She will let me know she has side effects. Explicit directions given. Continue yearly mammograms.  Done

## 2012-01-10 NOTE — Patient Instructions (Signed)
Continue yearly mammograms 

## 2012-01-24 ENCOUNTER — Other Ambulatory Visit: Payer: Self-pay | Admitting: Obstetrics and Gynecology

## 2014-01-25 ENCOUNTER — Encounter: Payer: Self-pay | Admitting: Obstetrics and Gynecology

## 2014-03-04 ENCOUNTER — Encounter (HOSPITAL_COMMUNITY): Payer: Self-pay | Admitting: Cardiology

## 2015-04-08 ENCOUNTER — Other Ambulatory Visit: Payer: Self-pay | Admitting: Cardiology

## 2015-04-08 ENCOUNTER — Ambulatory Visit
Admission: RE | Admit: 2015-04-08 | Discharge: 2015-04-08 | Disposition: A | Discharge status: Home / Self Care | Payer: Medicare Other | Source: Ambulatory Visit | Attending: Cardiology | Admitting: Cardiology

## 2015-04-08 DIAGNOSIS — R079 Chest pain, unspecified: Secondary | ICD-10-CM

## 2017-08-02 ENCOUNTER — Emergency Department (HOSPITAL_COMMUNITY): Payer: Medicare Other

## 2017-08-02 ENCOUNTER — Emergency Department (HOSPITAL_COMMUNITY)
Admission: EM | Admit: 2017-08-02 | Discharge: 2017-08-03 | Disposition: A | Discharge status: Home / Self Care | Payer: Medicare Other | Attending: Emergency Medicine | Admitting: Emergency Medicine

## 2017-08-02 ENCOUNTER — Encounter (HOSPITAL_COMMUNITY): Payer: Self-pay | Admitting: Emergency Medicine

## 2017-08-02 ENCOUNTER — Other Ambulatory Visit: Payer: Self-pay

## 2017-08-02 DIAGNOSIS — R42 Dizziness and giddiness: Secondary | ICD-10-CM | POA: Diagnosis present

## 2017-08-02 DIAGNOSIS — Z79899 Other long term (current) drug therapy: Secondary | ICD-10-CM | POA: Insufficient documentation

## 2017-08-02 DIAGNOSIS — Z8673 Personal history of transient ischemic attack (TIA), and cerebral infarction without residual deficits: Secondary | ICD-10-CM | POA: Insufficient documentation

## 2017-08-02 DIAGNOSIS — Z7982 Long term (current) use of aspirin: Secondary | ICD-10-CM | POA: Diagnosis not present

## 2017-08-02 DIAGNOSIS — H538 Other visual disturbances: Secondary | ICD-10-CM | POA: Diagnosis not present

## 2017-08-02 DIAGNOSIS — Z862 Personal history of diseases of the blood and blood-forming organs and certain disorders involving the immune mechanism: Secondary | ICD-10-CM | POA: Insufficient documentation

## 2017-08-02 DIAGNOSIS — Z87891 Personal history of nicotine dependence: Secondary | ICD-10-CM | POA: Insufficient documentation

## 2017-08-02 LAB — I-STAT TROPONIN, ED: Troponin i, poc: 0.01 ng/mL (ref 0.00–0.08)

## 2017-08-02 NOTE — ED Triage Notes (Signed)
C/o dizziness x 2 days and blurred vision since earlier today that is worse in L eye.  No arm drift.  History of strokes.  Speech clear.  No facial droop.  Some bilateral leg weakness that she relates to arthritis.

## 2017-08-03 ENCOUNTER — Emergency Department (HOSPITAL_COMMUNITY): Payer: Medicare Other

## 2017-08-03 ENCOUNTER — Encounter (HOSPITAL_COMMUNITY): Payer: Self-pay | Admitting: Radiology

## 2017-08-03 LAB — CBC
HCT: 37.6 % (ref 36.0–46.0)
Hemoglobin: 12.5 g/dL (ref 12.0–15.0)
MCH: 31.2 pg (ref 26.0–34.0)
MCHC: 33.2 g/dL (ref 30.0–36.0)
MCV: 93.8 fL (ref 78.0–100.0)
PLATELETS: 267 10*3/uL (ref 150–400)
RBC: 4.01 MIL/uL (ref 3.87–5.11)
RDW: 14.4 % (ref 11.5–15.5)
WBC: 7.3 10*3/uL (ref 4.0–10.5)

## 2017-08-03 LAB — COMPREHENSIVE METABOLIC PANEL
ALBUMIN: 3.4 g/dL — AB (ref 3.5–5.0)
ALK PHOS: 61 U/L (ref 38–126)
ALT: 11 U/L — ABNORMAL LOW (ref 14–54)
AST: 27 U/L (ref 15–41)
Anion gap: 8 (ref 5–15)
BILIRUBIN TOTAL: 0.5 mg/dL (ref 0.3–1.2)
BUN: 15 mg/dL (ref 6–20)
CALCIUM: 9.3 mg/dL (ref 8.9–10.3)
CO2: 27 mmol/L (ref 22–32)
CREATININE: 0.86 mg/dL (ref 0.44–1.00)
Chloride: 103 mmol/L (ref 101–111)
GFR calc Af Amer: >60 mL/min (ref >60)
GFR calc non Af Amer: >60 mL/min (ref >60)
GLUCOSE: 118 mg/dL — AB (ref 65–99)
Potassium: 3.7 mmol/L (ref 3.5–5.1)
Sodium: 138 mmol/L (ref 135–145)
TOTAL PROTEIN: 7.1 g/dL (ref 6.5–8.1)

## 2017-08-03 LAB — DIFFERENTIAL
BASOS ABS: 0 10*3/uL (ref 0.0–0.1)
Basophils Relative: 0 %
EOS PCT: 6 %
Eosinophils Absolute: 0.4 10*3/uL (ref 0.0–0.7)
Lymphocytes Relative: 49 %
Lymphs Abs: 3.6 10*3/uL (ref 0.7–4.0)
Monocytes Absolute: 0.5 10*3/uL (ref 0.1–1.0)
Monocytes Relative: 6 %
NEUTROS PCT: 39 %
Neutro Abs: 2.8 10*3/uL (ref 1.7–7.7)

## 2017-08-03 LAB — PROTIME-INR
INR: 0.99
Prothrombin Time: 13 seconds (ref 11.4–15.2)

## 2017-08-03 LAB — APTT: aPTT: 33 seconds (ref 24–36)

## 2017-08-03 MED ORDER — MECLIZINE HCL 25 MG PO TABS: 25.0000 mg | ORAL_TABLET | Freq: Three times a day (TID) | ORAL | 0 refills | Status: DC | PRN

## 2017-08-03 MED ORDER — LORAZEPAM 0.5 MG PO TABS: 0.5000 mg | ORAL_TABLET | Freq: Three times a day (TID) | ORAL | 0 refills | Status: AC | PRN

## 2017-08-03 MED ORDER — LORAZEPAM 2 MG/ML IJ SOLN
0.5000 mg | Freq: Once | INTRAMUSCULAR | Status: AC
  Administered 2017-08-03: 0.5 mg via INTRAVENOUS
  Filled 2017-08-03: qty 1

## 2017-08-03 MED ORDER — ASPIRIN EC 325 MG PO TBEC
325.0000 mg | DELAYED_RELEASE_TABLET | Freq: Once | ORAL | Status: AC
  Administered 2017-08-03: 325 mg via ORAL
  Filled 2017-08-03: qty 1

## 2017-08-03 MED ORDER — MECLIZINE HCL 25 MG PO TABS
25.0000 mg | ORAL_TABLET | Freq: Once | ORAL | Status: AC
  Administered 2017-08-03: 25 mg via ORAL
  Filled 2017-08-03: qty 1

## 2017-08-03 MED ORDER — SODIUM CHLORIDE 0.9 % IV BOLUS
500.0000 mL | Freq: Once | INTRAVENOUS | Status: AC
  Administered 2017-08-03: 500 mL via INTRAVENOUS

## 2017-08-03 MED ORDER — GADOBENATE DIMEGLUMINE 529 MG/ML IV SOLN
10.0000 mL | Freq: Once | INTRAVENOUS | Status: AC | PRN
  Administered 2017-08-03: 10 mL via INTRAVENOUS

## 2017-08-03 MED ORDER — ONDANSETRON HCL 4 MG/2ML IJ SOLN
4.0000 mg | Freq: Once | INTRAMUSCULAR | Status: AC
  Administered 2017-08-03: 4 mg via INTRAVENOUS
  Filled 2017-08-03: qty 2

## 2017-08-03 NOTE — ED Provider Notes (Signed)
[facility] EMERGENCY DEPARTMENT Provider Note   CSN: [id number] Arrival date & time: 08/02/17  2317     History   Chief Complaint Chief Complaint  Patient presents with  . Dizziness  . Blurred Vision    HPI [name] is a 77 y.o. female.  Pt presents to the ED today with dizziness since Thursday, May 9.  Blurry vision started last night.  She describes the blurry vision as a piece of her vision next to her nose is not there.  The dizziness has no relation to head movement or standing.  The pt has had prior strokes and is worried she is having another one.  She took ASA Friday, but has been in the waiting room for several hours, so has not taken anything this morning.     Past Medical History:  Diagnosis Date  . Arthritis   . Blood transfusion    " no reaction to transfusion "  . Brain tumor ([place])   . CIN I (cervical intraepithelial neoplasia I)   . Depression   . Diverticulitis   . Fibromyalgia   . GERD (gastroesophageal reflux disease)   . Hemorrhoids   . Kidney stone   . Osteoporosis   . PFO (patent foramen ovale)   . Stroke [facility])    X 3    Patient Active Problem List   Diagnosis Date Noted  . Diverticulitis   . Acute posthemorrhagic anemia 09/10/2011  . PFO (patent foramen ovale) 06/06/2011  . History of stroke 06/05/2011  . History of brain tumor 06/05/2011  . CIN I (cervical intraepithelial neoplasia I)   . Osteoporosis   . Stroke ([place])   . Kidney stone     Past Surgical History:  Procedure Laterality Date  . AUGMENTATION MAMMAPLASTY    . BRAIN BIOPSY    . BREAST ENHANCEMENT SURGERY    . COLPOSCOPY    . ESOPHAGOGASTRODUODENOSCOPY  09/11/2011   Procedure: ESOPHAGOGASTRODUODENOSCOPY (EGD);  Surgeon: Cleotis Nipper, MD;  Location: [facility] ENDOSCOPY;  Service: Endoscopy;  Laterality: N/A;  slight preference for doing bedside procedure  . KIDNEY STONE SURGERY    . PATENT FORAMEN OVALE CLOSURE  09/04/2011  . PATENT FORAMEN OVALE  CLOSURE N/A 09/04/2011   Procedure: PATENT FORAMEN OVALE CLOSURE;  Surgeon: Laverda Page, MD;  Location: [facility] CATH LAB;  Service: Cardiovascular;  Laterality: N/A;  . STOMACH SURGERY    . TEE WITHOUT CARDIOVERSION  06/06/2011   Procedure: TRANSESOPHAGEAL ECHOCARDIOGRAM (TEE);  Surgeon: Laverda Page, MD;  Location: [place];  Service: Cardiovascular;  Laterality: N/A;  . TUBAL LIGATION       OB History    Gravida  4   Para  3   Term  3   Preterm      AB  1   Living  3     SAB  1   TAB      Ectopic      Multiple      Live Births               Home Medications    Prior to Admission medications   Medication Sig Start Date End Date Taking? Authorizing Provider  CRANBERRY PO Take 1 capsule by mouth daily.   Yes [provider]  diphenhydramine-acetaminophen (TYLENOL PM) 25-500 MG TABS tablet Take 2 tablets by mouth at bedtime as needed (sleep).   Yes [provider]  losartan-hydrochlorothiazide (HYZAAR) 100-25 MG tablet Take 1 tablet by  mouth daily.   Yes [provider]  Melatonin 10 MG TABS Take 1 tablet by mouth at bedtime as needed (sleep).   Yes [provider]  pantoprazole (PROTONIX) 20 MG tablet Take 40 mg by mouth daily.   Yes [provider]  potassium chloride SA (K-DUR,KLOR-CON) 20 MEQ tablet Take 20 mEq by mouth daily.   Yes [provider]  TURMERIC PO Take 1 capsule by mouth daily.   Yes [provider]  acetaminophen (TYLENOL) 500 MG tablet Take 1,000 mg by mouth every evening.    [provider]  alendronate (FOSAMAX) 70 MG tablet Take 1 tablet (70 mg total) by mouth every 7 (seven) days. Take with a full glass of water on an empty stomach. 01/10/12   Bennetta Laos, MD  aspirin EC 81 MG tablet Take 81 mg by mouth daily.     [provider]  calcium citrate-vitamin D (CITRACAL+D) 315-200 MG-UNIT per tablet Take 2 tablets by mouth daily.     [provider]  Estradiol (VAGIFEM) 10 MCG TABS Use three times a week 01/10/12   Bennetta Laos, MD  fish oil-omega-3 fatty acids 1000 MG capsule Take 1 g by mouth daily.    [provider]  Loratadine (CLARITIN PO) Take by mouth.    [provider]  LORazepam (ATIVAN) 0.5 MG tablet Take 1 tablet (0.5 mg total) by mouth 3 (three) times daily as needed (dizziness). 08/03/17   Isla Pence, MD  meclizine (ANTIVERT) 25 MG tablet Take 1 tablet (25 mg total) by mouth 3 (three) times daily as needed for dizziness. 08/03/17   Isla Pence, MD  Multiple Vitamin (MULITIVITAMIN WITH MINERALS) TABS Take 1 tablet by mouth daily.    [provider]  Nutritional Supplements (MELATONIN PO) Take 1 tablet by mouth at bedtime.    [provider]  VAGIFEM 10 MCG TABS INSERT 1 TABLET VAGINALLY 3 TIMES A WEEK 01/24/12   Bennetta Laos, MD  clonazePAM (KLONOPIN) 0.5 MG tablet Take 0.5 mg by mouth daily.    06/04/11  [provider]    Family History Family History  Problem Relation Age of Onset  . Diabetes Mother   . Diabetes Maternal Grandfather   . Heart disease Paternal Grandfather   . Heart disease Father     Social History Social History   Tobacco Use  . Smoking status: Former Research scientist (life sciences)  . Smokeless tobacco: Never Used  Substance Use Topics  . Alcohol use: No  . Drug use: No     Allergies   Codeine and Lactose intolerance (gi)   Review of Systems Review of Systems  Eyes: Positive for visual disturbance.  Neurological: Positive for dizziness.  All other systems reviewed and are negative.    Physical Exam Updated Vital Signs BP 133/60   Pulse 69   Temp 98 F (36.7 C) (Oral)   Resp 15   LMP 03/27/1983   SpO2 95%   Physical Exam  Constitutional: She is oriented to person, place, and time. She appears well-developed and well-nourished.  HENT:  Head: Normocephalic and atraumatic.  Right Ear: External ear normal.  Left Ear:  External ear normal.  Nose: Nose normal.  Mouth/Throat: Oropharynx is clear and moist.  Eyes: Pupils are equal, round, and reactive to light. Conjunctivae and EOM are normal.  Left eye visual field loss upper, medial region  Neck: Normal range of motion. Neck supple.  Cardiovascular: Normal rate, regular rhythm, normal heart sounds  and intact distal pulses.  Pulmonary/Chest: Effort normal and breath sounds normal.  Abdominal: Soft. Bowel sounds are normal.  Musculoskeletal: Normal range of motion.  Neurological: She is alert and oriented to person, place, and time.  Skin: Skin is warm. Capillary refill takes less than 2 seconds.  Psychiatric: She has a normal mood and affect. Her behavior is normal. Thought content normal.  Nursing note and vitals reviewed.    ED Treatments / Results  Labs (all labs ordered are listed, but only abnormal results are displayed) Labs Reviewed  COMPREHENSIVE METABOLIC PANEL - Abnormal; Notable for the following components:      Result Value   Glucose, Bld 118 (*)    Albumin 3.4 (*)    ALT 11 (*)    All other components within normal limits  PROTIME-INR  APTT  CBC  DIFFERENTIAL  I-STAT TROPONIN, ED    EKG EKG Interpretation  Date/Time:  Friday Aug 02 2017 23:23:19 EDT Ventricular Rate:  70 PR Interval:  176 QRS Duration: 74 QT Interval:  404 QTC Calculation: 436 R Axis:   -30 Text Interpretation:  Normal sinus rhythm Possible Left atrial enlargement Left axis deviation Low voltage QRS Abnormal ECG When compared with ECG of 09/10/2011, No significant change was found Confirmed by Delora Fuel ([id number]) on 08/03/2017 1:27:16 AM   Radiology Ct Head Wo Contrast  Result Date: 08/03/2017 CLINICAL DATA:  77 year old female with dizziness and blurry vision. EXAM: CT HEAD WITHOUT CONTRAST TECHNIQUE: Contiguous axial images were obtained from the base of the skull through the vertex without intravenous contrast. COMPARISON:  Head CT dated 06/04/2011  and MRI dated 06/04/2011 FINDINGS: Brain: There is mild age-related atrophy and chronic microvascular ischemic changes. Areas of old infarct and encephalomalacia involving the right frontal lobe as well as right cerebellar hemisphere similar to prior studies. There is no acute intracranial hemorrhage. No mass effect or midline shift. No extra-axial fluid collection. Vascular: No hyperdense vessel or unexpected calcification. Skull: Right frontotemporal craniotomy. No acute calvarial pathology. Sinuses/Orbits: There is mild diffuse mucoperiosteal thickening of paranasal sinuses. Right maxillary sinus air-fluid levels. The mastoid air cells are clear. Other: None IMPRESSION: 1. No acute intracranial hemorrhage. 2. Postsurgical changes and encephalomalacia in the right frontal lobe as well as right cerebellar old infarct. 3. Paranasal sinus disease. Electronically Signed   By: Anner Crete M.D.   On: 08/03/2017 00:35   Mr Jeri Cos And Wo Contrast  Result Date: 08/03/2017 CLINICAL DATA:  Dizziness and blurred vision on the left, recent onset. EXAM: MRI HEAD WITHOUT AND WITH CONTRAST TECHNIQUE: Multiplanar, multiecho pulse sequences of the brain and surrounding structures were obtained without and with intravenous contrast. CONTRAST:  73mL MULTIHANCE GADOBENATE DIMEGLUMINE 529 MG/ML IV SOLN COMPARISON:  Head CT 08/02/2017. MRI 06/04/2011. older studies as distant as 10/04/2002 FINDINGS: Brain: Diffusion imaging does not show any acute or subacute infarction. Mild chronic small-vessel changes affect the pons. The cerebellum shows old infarctions bilaterally, most extensive in the right inferior cerebellum consistent with PICA territory stroke. Cerebral hemispheres show old lacunar infarction in the right lateral basal ganglia and mild chronic small-vessel ischemic changes elsewhere within the hemispheric white matter. There is been a previous right pterional craniotomy. Over time, going back as far as 2004, there  has been a very slow increase in T2 and FLAIR signal within the white matter adjacent to that region. 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 No abnormal enhancement occurs there or elsewhere. Vascular: Major vessels at  the base of the brain show flow. Skull and upper cervical spine: Otherwise negative Sinuses/Orbits: Clear except for acute sinusitis of the right maxillary sinus. Orbits negative. Other: None IMPRESSION: No acute intracranial finding. Extensive old infarctions affecting the cerebellum, most prominent in the right PICA territory. Chronic small-vessel ischemic changes affecting the cerebral hemispheres. Patient has had a previous right pterional craniotomy for biopsy of a brain lesion in the distant past. In reviewing studies as distant as 2004, there is a slow increase in T2 and FLAIR signal in the white matter of that region over time, most consistent with slow progression of low-grade astrocytoma. No sign of de differentiation or pronounced change. No contrast enhancement. Electronically Signed   By: Nelson Chimes M.D.   On: 08/03/2017 11:12    Procedures Procedures (including critical care time)  Medications Ordered in ED Medications  ondansetron (ZOFRAN) injection 4 mg (4 mg Intravenous Given 08/03/17 0831)  meclizine (ANTIVERT) tablet 25 mg (25 mg Oral Given 08/03/17 0831)  sodium chloride 0.9 % bolus 500 mL (0 mLs Intravenous Stopped 08/03/17 0932)  aspirin EC tablet 325 mg (325 mg Oral Given 08/03/17 0831)  gadobenate dimeglumine (MULTIHANCE) injection 10 mL (10 mLs Intravenous Contrast Given 08/03/17 1045)  LORazepam (ATIVAN) injection 0.5 mg (0.5 mg Intravenous Given 08/03/17 1204)     Initial Impression / Assessment and Plan / ED Course  I have reviewed the triage vital signs and the nursing notes.  Pertinent labs & imaging results that were available during my care of the patient were reviewed by me and considered in my medical decision making (see  chart for details).      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  She is feeling much better, but is still a little unsteady.  She feels ready to go.  She will be given a rx for a walker to help with ambulation.  F/u with neurology for dizziness.  She is encouraged to f/u with her eye doctor to reevaluate her eye and to return if worse.  She is given rx for walker.  Final Clinical Impressions(s) / ED Diagnoses   Final diagnoses:  Vertigo  Blurry vision, left eye    ED Discharge Orders        Ordered    meclizine (ANTIVERT) 25 MG tablet  3 times daily PRN     08/03/17 1348    LORazepam (ATIVAN) 0.5 MG tablet  3 times daily PRN     08/03/17 1348    For home use only DME 4 wheeled rolling walker with seat     08/03/17 1348       Isla Pence, MD 08/03/17 1349

## 2017-09-18 DIAGNOSIS — M48061 Spinal stenosis, lumbar region without neurogenic claudication: Secondary | ICD-10-CM | POA: Insufficient documentation

## 2017-10-22 ENCOUNTER — Other Ambulatory Visit: Payer: Self-pay | Admitting: Family Medicine

## 2017-10-22 DIAGNOSIS — C719 Malignant neoplasm of brain, unspecified: Secondary | ICD-10-CM

## 2017-10-24 ENCOUNTER — Ambulatory Visit
Admission: RE | Admit: 2017-10-24 | Discharge: 2017-10-24 | Disposition: A | Discharge status: Home / Self Care | Payer: Medicare Other | Source: Ambulatory Visit | Attending: Family Medicine | Admitting: Family Medicine

## 2017-10-24 DIAGNOSIS — C719 Malignant neoplasm of brain, unspecified: Secondary | ICD-10-CM

## 2017-10-24 MED ORDER — GADOBENATE DIMEGLUMINE 529 MG/ML IV SOLN
12.0000 mL | Freq: Once | INTRAVENOUS | Status: AC | PRN
  Administered 2017-10-24: 12 mL via INTRAVENOUS

## 2017-10-25 DIAGNOSIS — C719 Malignant neoplasm of brain, unspecified: Secondary | ICD-10-CM | POA: Insufficient documentation

## 2018-05-26 ENCOUNTER — Telehealth: Payer: Self-pay

## 2018-05-26 NOTE — Telephone Encounter (Signed)
We havent seen her since 2017 and at that time she was having right sided chest wall pain. Is the chest discomfort that she is having similar to this or different?

## 2018-05-26 NOTE — Telephone Encounter (Signed)
Pt called and stated that she's having some chest discomfort, no chest pain. She has an appt with [place] on April 1st. Do we want to see her sooner or keep the appt she has already?

## 2018-06-02 ENCOUNTER — Telehealth: Payer: Self-pay

## 2018-06-02 NOTE — Telephone Encounter (Signed)
Left vm for pt to return call  

## 2018-06-02 NOTE — Telephone Encounter (Signed)
We can see if she can be seen sooner. Hard to tell

## 2018-06-02 NOTE — Telephone Encounter (Signed)
Pt stated that she is still feeling some pressure , no chest pain.

## 2018-06-06 NOTE — Progress Notes (Signed)
Subjective:  Primary Physician:  Lujean Amel, MD  Patient ID: [name], female    DOB: 1940-10-22, 78 y.o.   MRN: [id number]  Chief Complaint  Patient presents with  . Chest Pain    chest pressure for 2-3 weeks ,abnormal bleeding     HPI: [name]  is a 78 y.o. female  with  history of CVA in March 2013, found to have a large ASD S/P successful closure with Helex septal occluder. No recurrence of TIA or stroke symptoms. She was last seen by me on 04/07/15 for musculoskeletal chest pain. Past medical history significant for stroke without significant Crestor the fact 2 including 2018 2013, prior history of tobacco use quit in 1971, essential hypertension.  She made an appointment to see me to to recurrence of chest pain that started about couple weeks ago, Described as chest tightness in the middle of the chest with during routine activities at home and also rest, episodes lasting for about 10-15 minutes.  On questioning, patient states that she has been markedly sedentary due to back pain and degenerative joint disease and has not done any significant physical activity that she can correlate with exertional chest discomfort.  She has no associated shortness of breath, diaphoresis or nausea.  No leg edema painful swelling of the lower extremity.  Past Medical History:  Diagnosis Date  . Arthritis   . Blood transfusion    " no reaction to transfusion "  . Brain tumor ([place])   . CIN I (cervical intraepithelial neoplasia I)   . Depression   . Diverticulitis   . Fibromyalgia   . GERD (gastroesophageal reflux disease)   . H/O atrial septal defect repair 09/04/2011   PFO closure  09/04/11: 25 mm Helex Septal occluder   . Hemorrhoids   . Kidney stone   . Osteoporosis   . PFO (patent foramen ovale)   . Stroke ([place])    X 3    Past Surgical History:  Procedure Laterality Date  . AUGMENTATION MAMMAPLASTY    . BRAIN BIOPSY    . BREAST ENHANCEMENT SURGERY    .  COLPOSCOPY    . ESOPHAGOGASTRODUODENOSCOPY  09/11/2011   Procedure: ESOPHAGOGASTRODUODENOSCOPY (EGD);  Surgeon: Cleotis Nipper, MD;  Location: [facility] ENDOSCOPY;  Service: Endoscopy;  Laterality: N/A;  slight preference for doing bedside procedure  . KIDNEY STONE SURGERY    . PATENT FORAMEN OVALE CLOSURE  09/04/2011  . PATENT FORAMEN OVALE CLOSURE N/A 09/04/2011   Procedure: PATENT FORAMEN OVALE CLOSURE;  Surgeon: Laverda Page, MD;  Location: [facility] CATH LAB;  Service: Cardiovascular;  Laterality: N/A;  . STOMACH SURGERY    . TEE WITHOUT CARDIOVERSION  06/06/2011   Procedure: TRANSESOPHAGEAL ECHOCARDIOGRAM (TEE);  Surgeon: Laverda Page, MD;  Location: [place];  Service: Cardiovascular;  Laterality: N/A;  . TUBAL LIGATION      Social History   Socioeconomic History  . Marital status: Married    Spouse name: Not on file  . Number of children: Not on file  . Years of education: Not on file  . Highest education level: Not on file  Occupational History  . Not on file  Social Needs  . Financial resource strain: Not on file  . Food insecurity:    Worry: Not on file    Inability: Not on file  . Transportation needs:    Medical: Not on file    Non-medical: Not on file  Tobacco Use  . Smoking  status: Former Research scientist (life sciences)  . Smokeless tobacco: Never Used  Substance and Sexual Activity  . Alcohol use: No  . Drug use: No  . Sexual activity: Yes    Birth control/protection: Surgical, Post-menopausal  Lifestyle  . Physical activity:    Days per week: Not on file    Minutes per session: Not on file  . Stress: Not on file  Relationships  . Social connections:    Talks on phone: Not on file    Gets together: Not on file    Attends religious service: Not on file    Active member of club or organization: Not on file    Attends meetings of clubs or organizations: Not on file    Relationship status: Not on file  . Intimate partner violence:    Fear of current or ex partner: Not on file     Emotionally abused: Not on file    Physically abused: Not on file    Forced sexual activity: Not on file  Other Topics Concern  . Not on file  Social History Narrative  . Not on file    Current Outpatient Medications on File Prior to Visit  Medication Sig Dispense Refill  . acetaminophen (TYLENOL) 500 MG tablet Take 1,000 mg by mouth every evening.    Marland Kitchen amoxicillin (AMOXIL) 500 MG capsule Take 1 capsule by mouth 3 (three) times daily.    Marland Kitchen aspirin EC 81 MG tablet Take 81 mg by mouth daily.     . calcium citrate-vitamin D (CITRACAL+D) 315-200 MG-UNIT per tablet Take 2 tablets by mouth daily.     Marland Kitchen CRANBERRY PO Take 1 capsule by mouth daily.    . diphenhydramine-acetaminophen (TYLENOL PM) 25-500 MG TABS tablet Take 2 tablets by mouth at bedtime as needed (sleep).    . Loratadine (CLARITIN PO) Take by mouth.    . losartan-hydrochlorothiazide (HYZAAR) 100-25 MG tablet Take 1 tablet by mouth daily.    . Melatonin 10 MG TABS Take 1 tablet by mouth at bedtime as needed (sleep).    . Multiple Vitamin (MULITIVITAMIN WITH MINERALS) TABS Take 1 tablet by mouth daily.    . NON FORMULARY Take 1 capsule by mouth. CBD capsule    . pantoprazole (PROTONIX) 20 MG tablet Take 40 mg by mouth daily.    . potassium chloride SA (KLOR-CON M20) 20 MEQ tablet Take 1 tablet by mouth daily.    . Turmeric Curcumin 500 MG CAPS Take 1 capsule by mouth daily.    Marland Kitchen LORazepam (ATIVAN) 0.5 MG tablet Take 1 tablet (0.5 mg total) by mouth 3 (three) times daily as needed (dizziness). 10 tablet 0  . [DISCONTINUED] clonazePAM (KLONOPIN) 0.5 MG tablet Take 0.5 mg by mouth daily.       No current facility-administered medications on file prior to visit.     Review of Systems  Constitutional: Negative for malaise/fatigue and weight loss.  Eyes: Positive for blurred vision (cataract).  Respiratory: Negative for cough, hemoptysis and shortness of breath.   Cardiovascular: Positive for chest pain. Negative for palpitations,  claudication and leg swelling.  Gastrointestinal: Negative for abdominal pain, blood in stool, constipation, heartburn and vomiting.  Genitourinary: Negative for dysuria.  Musculoskeletal: Positive for back pain. Negative for joint pain and myalgias. Neck pain: scoliosis.  Neurological: Negative for dizziness, focal weakness and headaches.  Endo/Heme/Allergies: Does not bruise/bleed easily.  Psychiatric/Behavioral: Negative for depression. The patient is not nervous/anxious.   All other systems reviewed and are negative.  Objective:  Blood pressure (!) 149/76, height 5\' 4"  (1.626 m), weight 129 lb (58.5 kg), last menstrual period 03/27/1983, SpO2 99 %. Body mass index is 22.14 kg/m.  Physical Exam  Constitutional: No distress.  Petite and well nurished  HENT:  Head: Atraumatic.  Eyes: Conjunctivae are normal.  Neck: Neck supple. No JVD present. No thyromegaly present.  Cardiovascular: Normal rate, regular rhythm, normal heart sounds and intact distal pulses. Exam reveals no gallop.  No murmur heard. Pulmonary/Chest: Effort normal and breath sounds normal.  Abdominal: Soft. Bowel sounds are normal.  Musculoskeletal: Normal range of motion.        General: No edema.  Neurological: She is alert.  Skin: Skin is warm and dry.  Psychiatric: She has a normal mood and affect.   CARDIAC STUDIES:   Echocardiogram 11/27/2012: 1. Left ventricular cavity is normal in size.   Diastolic filling with impaired relaxation pattern and normal to low pressure.   Normal global wall motion.   Normal systolic global function.   Calculated EF 68%.   Doppler evidence of Grade I (impaired) diastolic dysfunction. 2. Aortic valve structurally normal.   Trace aortic regurgitation. 3. Left atrium size is normal. The interatrial septal occluder is in good position without residual shunting. No thrombus. 4. Trace MR, TR, PI. No pulmonary hypertension. Compared t othe study done on 09/06/11, no significant  change noted.  PFO closure report 09/04/11: 25 mm Helex Septal occluder  Lower Extremity Venous Duplex Bilateral 06/06/2011: No evidence of deep vein or superficial thrombosisinvolving the right lower extremity and left lowerextremity.- No evidence of Baker's cyst on the right or left. Other specific details can be found in the table(s) above. Prepared and Electronically Authenticated by  Final event report 3/19-4/10/13: NSR. No A. Fibrillation. No symptoms recorded  TEE 06/06/11: Large PFO with strongly positive right to left shunt at rest and Valsalva  MRI Brain 06/04/11: Acute non hemorrhagic infarcts Lt frontal lobe. Large Rt frontal lobe, Rt cerebel  Recent Labs:    CMP Latest Ref Rng & Units 08/02/2017  Glucose 65 - 99 mg/dL 118(H)  BUN 6 - 20 mg/dL 15  Creatinine 0.44 - 1.00 mg/dL 0.86  Sodium 135 - 145 mmol/L 138  Potassium 3.5 - 5.1 mmol/L 3.7  Chloride 101 - 111 mmol/L 103  CO2 22 - 32 mmol/L 27  Calcium 8.9 - 10.3 mg/dL 9.3  Total Protein 6.5 - 8.1 g/dL 7.1  Total Bilirubin 0.3 - 1.2 mg/dL 0.5  Alkaline Phos 38 - 126 U/L 61  AST 15 - 41 U/L 27  ALT 14 - 54 U/L 11(L)   CBC Latest Ref Rng & Units 08/02/2017  WBC 4.0 - 10.5 K/uL 7.3  Hemoglobin 12.0 - 15.0 g/dL 12.5  Hematocrit 36.0 - 46.0 % 37.6  Platelets 150 - 400 K/uL 267    Lipid Panel     Component Value Date/Time   CHOL 135 06/05/2011 0605   TRIG 109 06/05/2011 0605   HDL 39 (L) 06/05/2011 0605   CHOLHDL 3.5 06/05/2011 0605   VLDL 22 06/05/2011 0605   LDLCALC 74 06/05/2011 0605    Assessment & Recommendations:   Atypical chest pain - Plan: EKG 12-Lead, PCV ECHOCARDIOGRAM COMPLETE, PCV MYOCARDIAL PERFUSION WITH LEXISCAN  Essential hypertension  H/O atrial septal defect repair - Plan: PCV ECHOCARDIOGRAM COMPLETE  H/O ischemic multifocal multiple vascular territories stroke - 05/2011 March of 2013 and also March 2008  EKG 06/09/2018: Normal sinus rhythm at rate of 65 bpm, left atrial  enlargement, left  axis deviation, left anterior fascicular block.  Poor R-wave progression, pulmonary disease pattern.  Borderline low voltage complexes. No significant change from  ECG 07/12/11: NSR @ 70/min, leftward axis. Otherwise normal.   Recommendation:   Patient made an appointment to see me as she has been having chest discomfort that started about 3-4 months ago, described as chest tightness in the middle of the chest.  Unfortunately she has not been able to exercise much due to severe back pain and scoliosis.  Symptoms are very difficult to delineate.  She does have cardiovascular risk factors including prior stroke, hypertension.  I do not have a recent labs, given her cerebrovascular atherosclerotic changes noted previously, she would benefit from being on a statin.  I'll try to obtain the results of the lipid panel that was performed recently, would like to see her back after the test.  Schedule for a Lexiscan Sestamibi stress test to evaluate for myocardial ischemia. Patient unable to do treadmill stress testing due to back pain.  Will schedule for an echocardiogram. Office visit following the work-up/investigations.   Adrian Prows, MD, [facility] 06/09/2018, 9:45 AM [facility]. [place] Pager: [phone] Office: [phone] If no answer Cell [phone]

## 2018-06-09 ENCOUNTER — Ambulatory Visit: Payer: Medicare Other | Admitting: Cardiology

## 2018-06-09 ENCOUNTER — Encounter: Payer: Self-pay | Admitting: Cardiology

## 2018-06-09 ENCOUNTER — Other Ambulatory Visit: Payer: Self-pay

## 2018-06-09 VITALS — BP 149/76 | Ht 64.0 in | Wt 129.0 lb

## 2018-06-09 DIAGNOSIS — I10 Essential (primary) hypertension: Secondary | ICD-10-CM | POA: Diagnosis not present

## 2018-06-09 DIAGNOSIS — Z8774 Personal history of (corrected) congenital malformations of heart and circulatory system: Secondary | ICD-10-CM | POA: Diagnosis not present

## 2018-06-09 DIAGNOSIS — Z8673 Personal history of transient ischemic attack (TIA), and cerebral infarction without residual deficits: Secondary | ICD-10-CM

## 2018-06-09 DIAGNOSIS — R0789 Other chest pain: Secondary | ICD-10-CM | POA: Diagnosis not present

## 2018-06-16 ENCOUNTER — Telehealth: Payer: Self-pay

## 2018-06-16 NOTE — Telephone Encounter (Signed)
Left message on mobile and home phone for pt to call back to reschedule testing due to COVID-19.

## 2018-06-18 ENCOUNTER — Other Ambulatory Visit: Payer: Medicare Other

## 2018-06-20 ENCOUNTER — Other Ambulatory Visit: Payer: Medicare Other

## 2018-06-25 ENCOUNTER — Other Ambulatory Visit: Payer: Medicare Other

## 2018-06-25 ENCOUNTER — Ambulatory Visit: Payer: Self-pay | Admitting: Cardiology

## 2018-07-03 ENCOUNTER — Other Ambulatory Visit: Payer: Self-pay

## 2018-07-03 ENCOUNTER — Ambulatory Visit (INDEPENDENT_AMBULATORY_CARE_PROVIDER_SITE_OTHER): Payer: Medicare Other

## 2018-07-03 DIAGNOSIS — Z8774 Personal history of (corrected) congenital malformations of heart and circulatory system: Secondary | ICD-10-CM | POA: Diagnosis not present

## 2018-07-03 DIAGNOSIS — R0789 Other chest pain: Secondary | ICD-10-CM

## 2018-07-07 ENCOUNTER — Other Ambulatory Visit: Payer: Medicare Other

## 2018-07-09 ENCOUNTER — Ambulatory Visit: Payer: Medicare Other | Admitting: Cardiology

## 2018-07-14 ENCOUNTER — Other Ambulatory Visit: Payer: Self-pay

## 2018-07-14 ENCOUNTER — Ambulatory Visit (INDEPENDENT_AMBULATORY_CARE_PROVIDER_SITE_OTHER): Payer: Medicare Other

## 2018-07-14 DIAGNOSIS — R0789 Other chest pain: Secondary | ICD-10-CM

## 2018-07-16 ENCOUNTER — Telehealth: Payer: Self-pay

## 2018-07-16 NOTE — Telephone Encounter (Signed)
-----   Message from Adrian Prows, MD sent at 07/14/2018  6:04 PM EDT ----- Normal stress test.

## 2018-07-16 NOTE — Telephone Encounter (Signed)
lvm for pt stating normal stress test

## 2018-12-08 ENCOUNTER — Ambulatory Visit (INDEPENDENT_AMBULATORY_CARE_PROVIDER_SITE_OTHER): Payer: Medicare Other | Admitting: Podiatry

## 2018-12-08 ENCOUNTER — Other Ambulatory Visit: Payer: Self-pay

## 2018-12-08 ENCOUNTER — Encounter: Payer: Self-pay | Admitting: Podiatry

## 2018-12-08 DIAGNOSIS — L309 Dermatitis, unspecified: Secondary | ICD-10-CM | POA: Diagnosis not present

## 2018-12-08 MED ORDER — TERBINAFINE HCL 250 MG PO TABS: 250.0000 mg | ORAL_TABLET | Freq: Every day | ORAL | 0 refills | Status: AC

## 2018-12-08 MED ORDER — METHYLPREDNISOLONE 4 MG PO TBPK: ORAL_TABLET | ORAL | 0 refills | Status: AC

## 2018-12-08 NOTE — Progress Notes (Signed)
Subjective:   Patient ID: [name], female   DOB: 78 y.o.   MRN: [id number]   HPI Patient states she has had some drainage in both of her feet left over right and states that she remembers having this as a kid.  Does not remember anything else specific and is tried a betamethasone Chlortrimazole cream which is not been effective   Review of Systems  All other systems reviewed and are negative.       Objective:  Physical Exam Vitals signs and nursing note reviewed.  Constitutional:      Appearance: She is well-developed.  Pulmonary:     Effort: Pulmonary effort is normal.  Musculoskeletal: Normal range of motion.  Skin:    General: Skin is warm.  Neurological:     Mental Status: She is alert.     Neurovascular status found to be intact muscle strength was found to be adequate with blistering of the plantar left over right foot plantar and slightly under the hallux of both feet fifth digits bilateral.  There is no proximal edema erythema drainage noted     Assessment:  Probably a acute inflammatory condition with possibility of overlapping fungal condition     Plan:  H&P all conditions reviewed and discussed.  At this point organ to start her on a Medrol Dosepak 6-day followed by a 30-day oral antifungal and I gave instructions for soaks and continue topical usage and will be seen back as symptoms indicate

## 2018-12-11 ENCOUNTER — Ambulatory Visit: Payer: Self-pay | Admitting: Podiatry

## 2018-12-25 ENCOUNTER — Ambulatory Visit: Payer: Medicare Other | Admitting: Podiatry

## 2018-12-25 ENCOUNTER — Other Ambulatory Visit: Payer: Self-pay

## 2018-12-25 ENCOUNTER — Encounter: Payer: Self-pay | Admitting: Podiatry

## 2018-12-25 DIAGNOSIS — L02619 Cutaneous abscess of unspecified foot: Secondary | ICD-10-CM | POA: Diagnosis not present

## 2018-12-25 DIAGNOSIS — L03119 Cellulitis of unspecified part of limb: Secondary | ICD-10-CM | POA: Diagnosis not present

## 2018-12-25 DIAGNOSIS — L309 Dermatitis, unspecified: Secondary | ICD-10-CM | POA: Diagnosis not present

## 2018-12-25 MED ORDER — DOXYCYCLINE HYCLATE 100 MG PO TABS: 100.0000 mg | ORAL_TABLET | Freq: Two times a day (BID) | ORAL | 0 refills | Status: AC

## 2018-12-25 MED ORDER — TERBINAFINE HCL 250 MG PO TABS: 250.0000 mg | ORAL_TABLET | Freq: Every day | ORAL | 0 refills | Status: AC

## 2018-12-31 NOTE — Progress Notes (Signed)
Subjective:   Patient ID: [name], female   DOB: 78 y.o.   MRN: [id number]   HPI Patient presents stating that the medicine has stopped the oozing but it has come back and is now itching more in the middle of the foot   ROS      Objective:  Physical Exam  Neurovascular status intact with an area of irritated tissue plantar aspect left foot that is localized with no breakdown of tissue no proximal edema erythema drainage noted     Assessment:  Probability for some form of fungal infection or inflammatory reaction dermatitis type tissue formation     Plan:  H&P reviewed condition and we are going to go for a longer course of a oral antifungal and I did discuss if this does not improve that I want her to see a dermatologist and I made several recommendations.  Educated her on this and if any redness drainage or proximal problem should occur patient is to let us know immediately signed visit

## 2019-02-13 IMAGING — MR MR HEAD WO/W CM
13 series · 48 of 48 positions shown · IV contrast (multihance)
Comparison: MRI head 08/03/2017, 06/04/2011

CLINICAL DATA: Low-grade glioma of brain. Craniotomy 6113. Recent
dizziness.

EXAM:
MRI HEAD WITHOUT AND WITH CONTRAST
TECHNIQUE: Multiplanar, multiecho pulse sequences of the brain and surrounding
structures were obtained without and with intravenous contrast.
CONTRAST:  12mL MULTIHANCE GADOBENATE DIMEGLUMINE 529 MG/ML IV SOLN

[Series 2: T1 · sagittal · 5.0mm · 0.45mm/px · 1 of 25 slices shown]
[im 1/25]
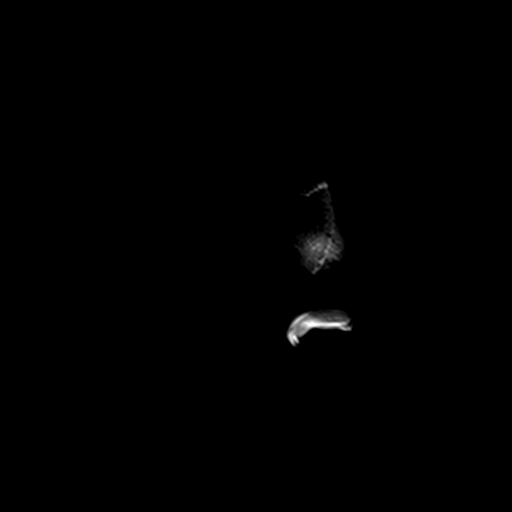

[Series 3: DWI · axial · 3.0mm · 1.80mm/px · z∈[-53,+106]mm · 6 of 108 slices shown (1 of 4)]
[im 1/108]
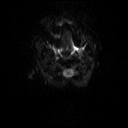
[im 22/108]
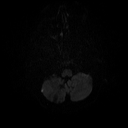
[im 43/108]
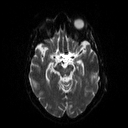
[im 65/108]
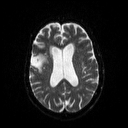
[im 86/108]
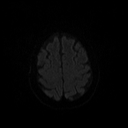
[im 108/108]
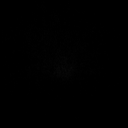

[Series 4: DWI · axial · 3.0mm · 1.80mm/px · z∈[-53,+106]mm · 3 of 50 slices shown (2 of 4)]
[im 1/50]
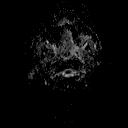
[im 25/50]
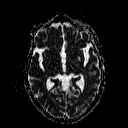
[im 50/50]
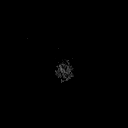

[Series 5: DWI · coronal · 5.0mm · 1.80mm/px · 5 of 83 slices shown (3 of 4)]
[im 1/83]
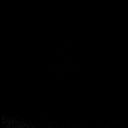
[im 21/83]
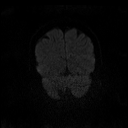
[im 42/83]
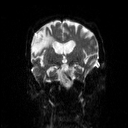
[im 62/83]
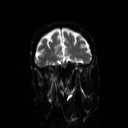
[im 83/83]
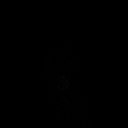

[Series 6: DWI · coronal · 5.0mm · 1.80mm/px · 3 of 43 slices shown (4 of 4)]
[im 1/43]
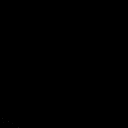
[im 22/43]
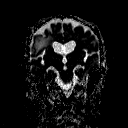
[im 43/43]
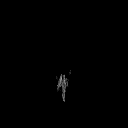

[Series 7: T2 · axial · 5.0mm · 0.51mm/px · z∈[-62,+106]mm · 2 of 26 slices shown (1 of 2)]
[im 1/26]
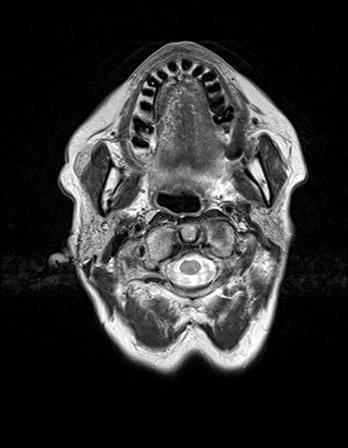
[im 26/26]
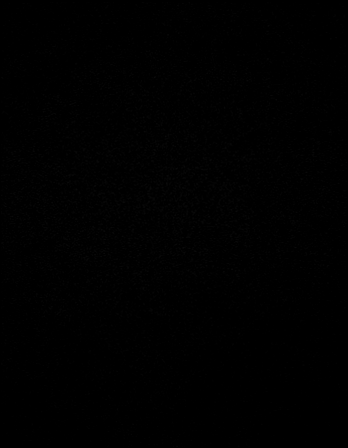

[Series 8: FLAIR · axial · 3.0mm · 0.45mm/px · z∈[-57,+101]mm · 2 of 35 slices shown]
[im 1/35]
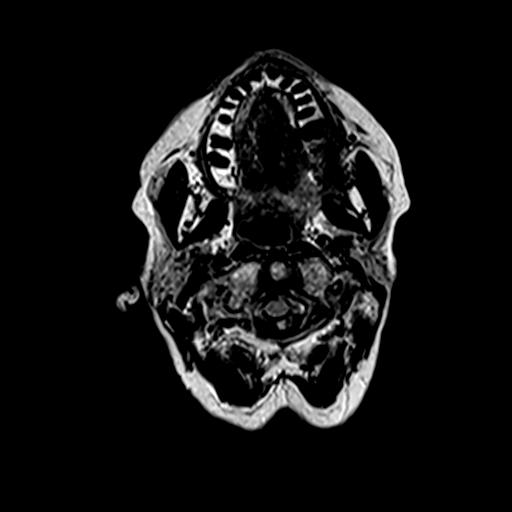
[im 35/35]
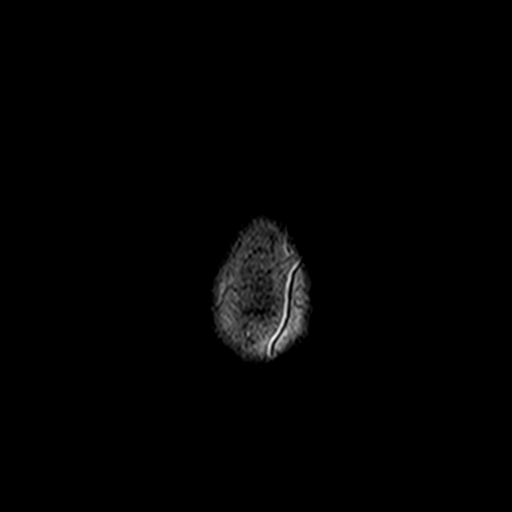

[Series 10: swi_images · axial · 5.0mm · 0.90mm/px · z∈[-54,+101]mm · 2 of 32 slices shown]
[im 1/32]
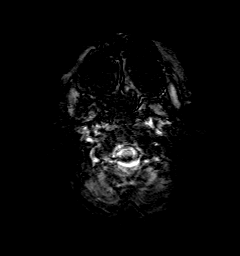
[im 32/32]
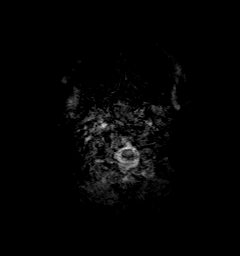

[Series 11: t1_mpr_tra · axial · 1.0mm · 0.75mm/px · z∈[-41,+102]mm · 9 of 144 slices shown (1 of 2)]
[im 1/144]
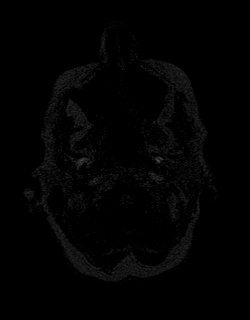
[im 18/144]
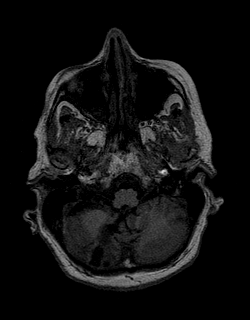
[im 36/144]
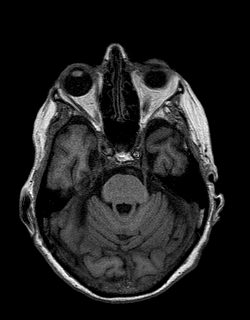
[im 54/144]
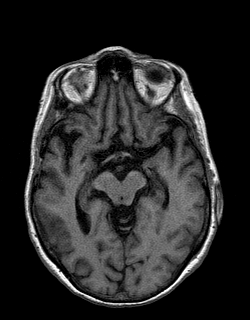
[im 72/144]
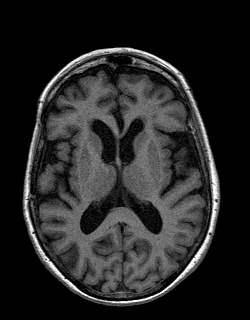
[im 90/144]
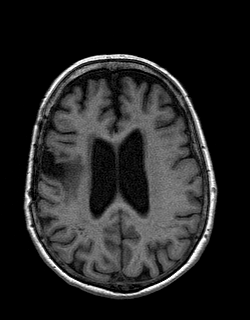
[im 108/144]
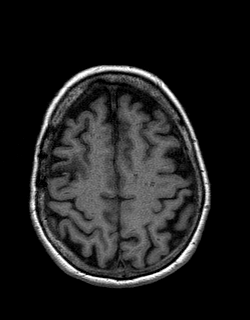
[im 126/144]
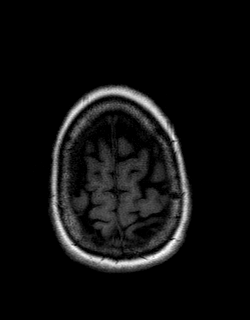
[im 144/144]
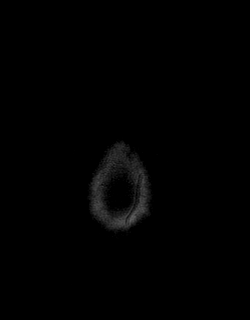

[Series 12: T2 · coronal · 5.0mm · 0.45mm/px · 2 of 34 slices shown (2 of 2)]
[im 1/34]
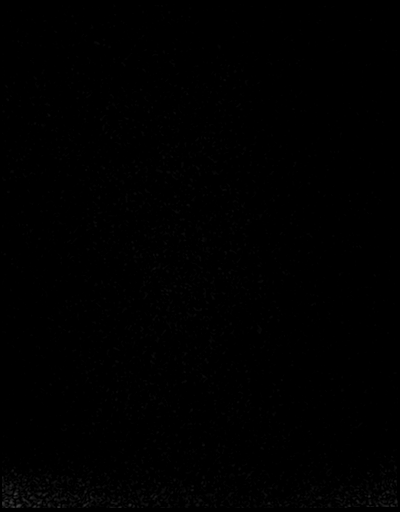
[im 34/34]
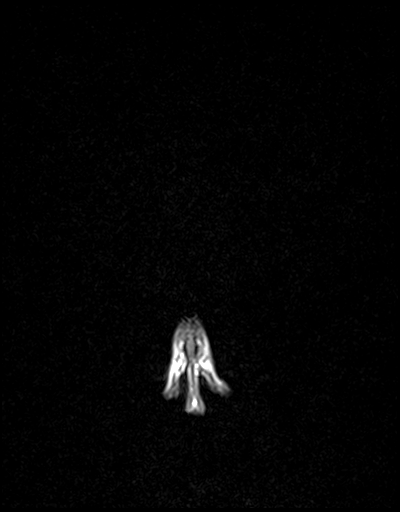

[Series 13: t1_mpr_tra · axial · 1.0mm · 0.75mm/px · z∈[-41,+102]mm · 9 of 144 slices shown (2 of 2)]
[im 1/144]
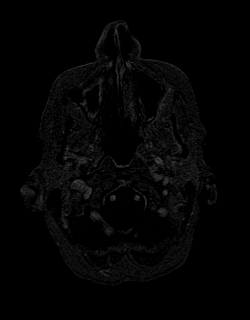
[im 18/144]
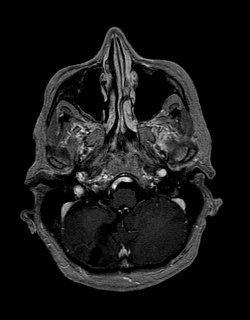
[im 36/144]
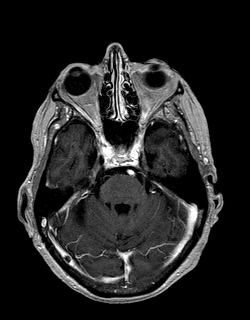
[im 54/144]
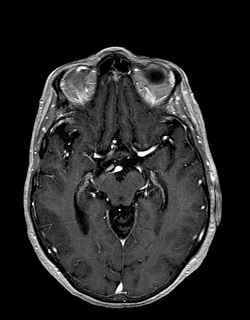
[im 72/144]
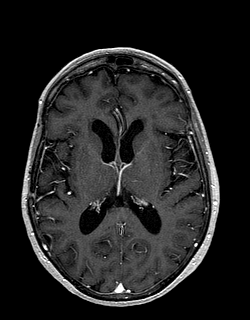
[im 90/144]
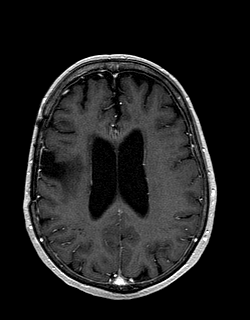
[im 108/144]
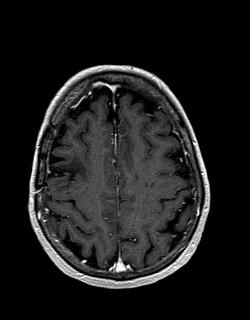
[im 126/144]
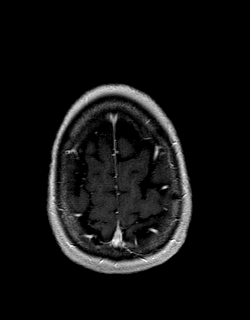
[im 144/144]
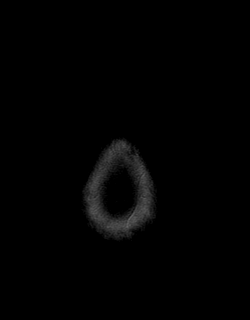

[Series 14: post cor · coronal · 5.0mm · 0.45mm/px · 2 of 34 slices shown]
[im 1/34]
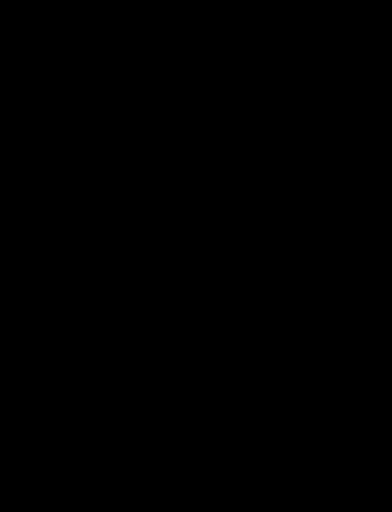
[im 34/34]
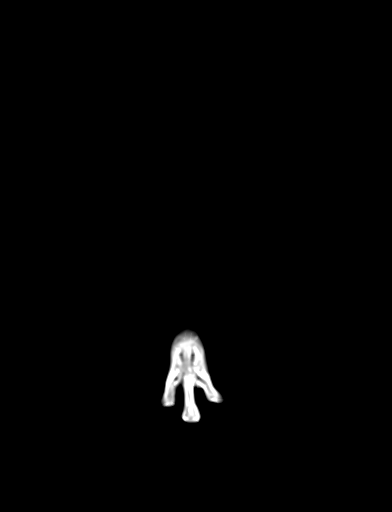

[Series 15: post sag (optional · sagittal · 5.0mm · 0.45mm/px · 2 of 27 slices shown]
[im 1/27]
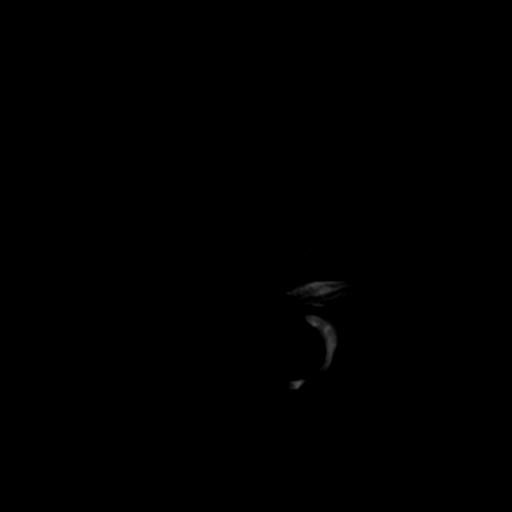
[im 27/27]
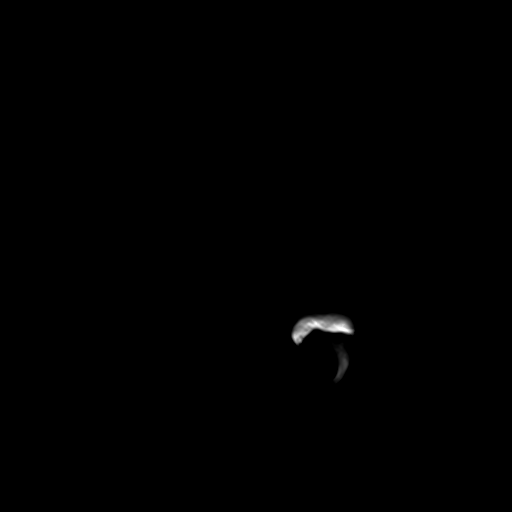

[48 of 48 positions shown; findings below may reference images not displayed]

FINDINGS: Brain: Right craniotomy for tumor resection. Surgical cavity in the
right posterior frontal lobe is stable. T2 white matter
hyperintensity surrounding the surgical site is stable. No enhancing
tumor identified.

Mild atrophy. Chronic right PICA infarct with small chronic
cerebellar infarcts bilaterally. Mild chronic microvascular ischemic
changes in the white matter. Negative for acute infarct or
hemorrhage.

Vascular: Normal arterial flow voids

Skull and upper cervical spine: Right-sided craniotomy. No acute
skeletal abnormality.

Sinuses/Orbits: Air-fluid level and mucosal edema right maxillary
sinus. Mild mucosal edema throughout the paranasal sinuses. Negative
orbit.

Other: None
IMPRESSION: No acute abnormality.

Stable postsurgical changes right frontal lobe tumor resection. No
recurrent tumor and no acute infarct.

Air-fluid level right maxillary sinus.

## 2019-04-01 DIAGNOSIS — I10 Essential (primary) hypertension: Secondary | ICD-10-CM | POA: Diagnosis not present

## 2019-04-01 DIAGNOSIS — E7800 Pure hypercholesterolemia, unspecified: Secondary | ICD-10-CM | POA: Diagnosis not present

## 2019-04-01 DIAGNOSIS — C719 Malignant neoplasm of brain, unspecified: Secondary | ICD-10-CM | POA: Diagnosis not present

## 2019-04-01 DIAGNOSIS — E876 Hypokalemia: Secondary | ICD-10-CM | POA: Diagnosis not present

## 2019-05-16 ENCOUNTER — Ambulatory Visit: Payer: Medicare HMO | Attending: Internal Medicine

## 2019-05-16 DIAGNOSIS — Z23 Encounter for immunization: Secondary | ICD-10-CM

## 2019-05-16 NOTE — Progress Notes (Signed)
   Covid-19 Vaccination Clinic  Name:  [name]    MRN: [id number] DOB: Jan 29, 1941  05/16/2019  [name] was observed post Covid-19 immunization for 15 minutes without incidence. She was provided with Vaccine Information Sheet and instruction to access the V-Safe system.   [name] was instructed to call 911 with any severe reactions post vaccine: Marland Kitchen Difficulty breathing  . Swelling of your face and throat  . A fast heartbeat  . A bad rash all over your body  . Dizziness and weakness    Immunizations Administered    Name Date Dose VIS Date Route   Pfizer COVID-19 Vaccine 05/16/2019  9:11 AM 0.3 mL 03/06/2019 Intramuscular   Manufacturer: [place]   Lot: [id number]   [place]: [id number]

## 2019-06-08 ENCOUNTER — Ambulatory Visit: Payer: Medicare HMO | Attending: Internal Medicine

## 2019-06-08 DIAGNOSIS — Z23 Encounter for immunization: Secondary | ICD-10-CM

## 2019-06-08 NOTE — Progress Notes (Signed)
   Covid-19 Vaccination Clinic  Name:  [name]    MRN: [id number] DOB: 02-15-1941  06/08/2019  [name] was observed post Covid-19 immunization for 15 minutes without incident. She was provided with Vaccine Information Sheet and instruction to access the V-Safe system.   [name] was instructed to call 911 with any severe reactions post vaccine: Marland Kitchen Difficulty breathing  . Swelling of face and throat  . A fast heartbeat  . A bad rash all over body  . Dizziness and weakness   Immunizations Administered    Name Date Dose VIS Date Route   Pfizer COVID-19 Vaccine 06/08/2019 10:03 AM 0.3 mL 03/06/2019 Intramuscular   Manufacturer: [place]   Lot: [id number]   [place]: [id number]

## 2019-07-16 DIAGNOSIS — M4156 Other secondary scoliosis, lumbar region: Secondary | ICD-10-CM | POA: Diagnosis not present

## 2019-07-16 DIAGNOSIS — M48061 Spinal stenosis, lumbar region without neurogenic claudication: Secondary | ICD-10-CM | POA: Diagnosis not present

## 2019-08-03 DIAGNOSIS — M5136 Other intervertebral disc degeneration, lumbar region: Secondary | ICD-10-CM | POA: Diagnosis not present

## 2019-09-01 DIAGNOSIS — M48061 Spinal stenosis, lumbar region without neurogenic claudication: Secondary | ICD-10-CM | POA: Diagnosis not present

## 2019-09-01 DIAGNOSIS — M5136 Other intervertebral disc degeneration, lumbar region: Secondary | ICD-10-CM | POA: Diagnosis not present

## 2019-09-01 DIAGNOSIS — M4156 Other secondary scoliosis, lumbar region: Secondary | ICD-10-CM | POA: Diagnosis not present

## 2019-09-14 DIAGNOSIS — M5136 Other intervertebral disc degeneration, lumbar region: Secondary | ICD-10-CM | POA: Diagnosis not present

## 2019-10-07 DIAGNOSIS — M5136 Other intervertebral disc degeneration, lumbar region: Secondary | ICD-10-CM | POA: Diagnosis not present

## 2019-10-07 DIAGNOSIS — M48061 Spinal stenosis, lumbar region without neurogenic claudication: Secondary | ICD-10-CM | POA: Diagnosis not present

## 2019-10-07 DIAGNOSIS — M4156 Other secondary scoliosis, lumbar region: Secondary | ICD-10-CM | POA: Diagnosis not present

## 2019-10-12 DIAGNOSIS — H2513 Age-related nuclear cataract, bilateral: Secondary | ICD-10-CM | POA: Diagnosis not present

## 2019-12-02 DIAGNOSIS — Z23 Encounter for immunization: Secondary | ICD-10-CM | POA: Diagnosis not present

## 2019-12-02 DIAGNOSIS — Z79899 Other long term (current) drug therapy: Secondary | ICD-10-CM | POA: Diagnosis not present

## 2019-12-02 DIAGNOSIS — C719 Malignant neoplasm of brain, unspecified: Secondary | ICD-10-CM | POA: Diagnosis not present

## 2019-12-02 DIAGNOSIS — E7800 Pure hypercholesterolemia, unspecified: Secondary | ICD-10-CM | POA: Diagnosis not present

## 2019-12-02 DIAGNOSIS — E2839 Other primary ovarian failure: Secondary | ICD-10-CM | POA: Diagnosis not present

## 2019-12-02 DIAGNOSIS — I10 Essential (primary) hypertension: Secondary | ICD-10-CM | POA: Diagnosis not present

## 2019-12-02 DIAGNOSIS — Z0001 Encounter for general adult medical examination with abnormal findings: Secondary | ICD-10-CM | POA: Diagnosis not present

## 2019-12-07 ENCOUNTER — Other Ambulatory Visit: Payer: Self-pay | Admitting: Family Medicine

## 2019-12-07 DIAGNOSIS — E2839 Other primary ovarian failure: Secondary | ICD-10-CM

## 2019-12-21 DIAGNOSIS — R300 Dysuria: Secondary | ICD-10-CM | POA: Diagnosis not present

## 2020-03-11 ENCOUNTER — Inpatient Hospital Stay: Admission: RE | Admit: 2020-03-11 | Payer: Medicare HMO | Source: Ambulatory Visit

## 2020-03-29 DIAGNOSIS — M4804 Spinal stenosis, thoracic region: Secondary | ICD-10-CM | POA: Diagnosis not present

## 2020-03-29 DIAGNOSIS — M419 Scoliosis, unspecified: Secondary | ICD-10-CM | POA: Diagnosis not present

## 2020-03-29 DIAGNOSIS — M4805 Spinal stenosis, thoracolumbar region: Secondary | ICD-10-CM | POA: Diagnosis not present

## 2020-03-29 DIAGNOSIS — M5124 Other intervertebral disc displacement, thoracic region: Secondary | ICD-10-CM | POA: Diagnosis not present

## 2020-03-29 DIAGNOSIS — M47814 Spondylosis without myelopathy or radiculopathy, thoracic region: Secondary | ICD-10-CM | POA: Diagnosis not present

## 2020-04-26 DIAGNOSIS — G894 Chronic pain syndrome: Secondary | ICD-10-CM | POA: Diagnosis not present

## 2020-05-03 DIAGNOSIS — G894 Chronic pain syndrome: Secondary | ICD-10-CM | POA: Diagnosis not present

## 2020-05-03 DIAGNOSIS — M5136 Other intervertebral disc degeneration, lumbar region: Secondary | ICD-10-CM | POA: Diagnosis not present

## 2020-05-03 DIAGNOSIS — M48062 Spinal stenosis, lumbar region with neurogenic claudication: Secondary | ICD-10-CM | POA: Diagnosis not present

## 2020-05-31 DIAGNOSIS — M5136 Other intervertebral disc degeneration, lumbar region: Secondary | ICD-10-CM | POA: Diagnosis not present

## 2020-09-10 DIAGNOSIS — Z9882 Breast implant status: Secondary | ICD-10-CM | POA: Diagnosis not present

## 2020-09-10 DIAGNOSIS — M545 Low back pain, unspecified: Secondary | ICD-10-CM | POA: Diagnosis not present

## 2020-09-10 DIAGNOSIS — I10 Essential (primary) hypertension: Secondary | ICD-10-CM | POA: Diagnosis not present

## 2020-09-10 DIAGNOSIS — Z9889 Other specified postprocedural states: Secondary | ICD-10-CM | POA: Diagnosis not present

## 2020-09-10 DIAGNOSIS — M25551 Pain in right hip: Secondary | ICD-10-CM | POA: Diagnosis not present

## 2020-09-10 DIAGNOSIS — R82998 Other abnormal findings in urine: Secondary | ICD-10-CM | POA: Diagnosis not present

## 2020-09-10 DIAGNOSIS — M25572 Pain in left ankle and joints of left foot: Secondary | ICD-10-CM | POA: Diagnosis not present

## 2020-09-10 DIAGNOSIS — Z79899 Other long term (current) drug therapy: Secondary | ICD-10-CM | POA: Diagnosis not present

## 2020-09-10 DIAGNOSIS — R52 Pain, unspecified: Secondary | ICD-10-CM | POA: Diagnosis not present

## 2020-09-10 DIAGNOSIS — R109 Unspecified abdominal pain: Secondary | ICD-10-CM | POA: Diagnosis not present

## 2020-09-10 DIAGNOSIS — R911 Solitary pulmonary nodule: Secondary | ICD-10-CM | POA: Diagnosis not present

## 2020-09-10 DIAGNOSIS — M5459 Other low back pain: Secondary | ICD-10-CM | POA: Diagnosis not present

## 2020-09-10 DIAGNOSIS — G8929 Other chronic pain: Secondary | ICD-10-CM | POA: Diagnosis not present

## 2020-09-10 DIAGNOSIS — M549 Dorsalgia, unspecified: Secondary | ICD-10-CM | POA: Diagnosis not present

## 2020-09-10 DIAGNOSIS — M419 Scoliosis, unspecified: Secondary | ICD-10-CM | POA: Diagnosis not present

## 2020-09-20 DIAGNOSIS — Z23 Encounter for immunization: Secondary | ICD-10-CM | POA: Diagnosis not present

## 2020-09-20 DIAGNOSIS — M419 Scoliosis, unspecified: Secondary | ICD-10-CM | POA: Diagnosis not present

## 2020-09-20 DIAGNOSIS — R0602 Shortness of breath: Secondary | ICD-10-CM | POA: Diagnosis not present

## 2020-09-20 DIAGNOSIS — Z79899 Other long term (current) drug therapy: Secondary | ICD-10-CM | POA: Diagnosis not present

## 2020-09-20 DIAGNOSIS — R059 Cough, unspecified: Secondary | ICD-10-CM | POA: Diagnosis not present

## 2020-09-20 DIAGNOSIS — M5440 Lumbago with sciatica, unspecified side: Secondary | ICD-10-CM | POA: Diagnosis not present

## 2020-09-20 DIAGNOSIS — R509 Fever, unspecified: Secondary | ICD-10-CM | POA: Diagnosis not present

## 2020-09-20 DIAGNOSIS — M5430 Sciatica, unspecified side: Secondary | ICD-10-CM | POA: Diagnosis not present

## 2020-09-20 DIAGNOSIS — I10 Essential (primary) hypertension: Secondary | ICD-10-CM | POA: Diagnosis not present

## 2020-09-20 DIAGNOSIS — U071 COVID-19: Secondary | ICD-10-CM | POA: Diagnosis not present

## 2020-10-26 DIAGNOSIS — I10 Essential (primary) hypertension: Secondary | ICD-10-CM | POA: Diagnosis not present

## 2020-10-26 DIAGNOSIS — D496 Neoplasm of unspecified behavior of brain: Secondary | ICD-10-CM | POA: Diagnosis not present

## 2021-01-31 DIAGNOSIS — Z23 Encounter for immunization: Secondary | ICD-10-CM | POA: Diagnosis not present

## 2021-03-28 DIAGNOSIS — I10 Essential (primary) hypertension: Secondary | ICD-10-CM | POA: Diagnosis not present

## 2021-03-28 DIAGNOSIS — M810 Age-related osteoporosis without current pathological fracture: Secondary | ICD-10-CM | POA: Diagnosis not present

## 2021-03-28 DIAGNOSIS — C719 Malignant neoplasm of brain, unspecified: Secondary | ICD-10-CM | POA: Diagnosis not present

## 2021-03-28 DIAGNOSIS — R911 Solitary pulmonary nodule: Secondary | ICD-10-CM | POA: Diagnosis not present

## 2021-03-29 DIAGNOSIS — R5383 Other fatigue: Secondary | ICD-10-CM | POA: Diagnosis not present

## 2021-03-29 DIAGNOSIS — E559 Vitamin D deficiency, unspecified: Secondary | ICD-10-CM | POA: Diagnosis not present

## 2021-03-29 DIAGNOSIS — Z8249 Family history of ischemic heart disease and other diseases of the circulatory system: Secondary | ICD-10-CM | POA: Diagnosis not present

## 2021-03-29 DIAGNOSIS — E538 Deficiency of other specified B group vitamins: Secondary | ICD-10-CM | POA: Diagnosis not present

## 2021-03-29 DIAGNOSIS — R899 Unspecified abnormal finding in specimens from other organs, systems and tissues: Secondary | ICD-10-CM | POA: Diagnosis not present

## 2021-03-29 DIAGNOSIS — R7303 Prediabetes: Secondary | ICD-10-CM | POA: Diagnosis not present

## 2021-03-29 DIAGNOSIS — E782 Mixed hyperlipidemia: Secondary | ICD-10-CM | POA: Diagnosis not present

## 2021-04-06 DIAGNOSIS — R911 Solitary pulmonary nodule: Secondary | ICD-10-CM | POA: Diagnosis not present

## 2021-04-06 DIAGNOSIS — E785 Hyperlipidemia, unspecified: Secondary | ICD-10-CM | POA: Diagnosis not present

## 2021-04-06 DIAGNOSIS — E2839 Other primary ovarian failure: Secondary | ICD-10-CM | POA: Diagnosis not present

## 2021-04-06 DIAGNOSIS — C719 Malignant neoplasm of brain, unspecified: Secondary | ICD-10-CM | POA: Diagnosis not present

## 2021-04-06 DIAGNOSIS — I10 Essential (primary) hypertension: Secondary | ICD-10-CM | POA: Diagnosis not present

## 2021-04-26 DIAGNOSIS — M810 Age-related osteoporosis without current pathological fracture: Secondary | ICD-10-CM | POA: Diagnosis not present

## 2021-04-26 DIAGNOSIS — R911 Solitary pulmonary nodule: Secondary | ICD-10-CM | POA: Diagnosis not present

## 2021-04-26 DIAGNOSIS — E2839 Other primary ovarian failure: Secondary | ICD-10-CM | POA: Diagnosis not present

## 2021-04-27 DIAGNOSIS — M810 Age-related osteoporosis without current pathological fracture: Secondary | ICD-10-CM | POA: Diagnosis not present

## 2021-04-27 DIAGNOSIS — I10 Essential (primary) hypertension: Secondary | ICD-10-CM | POA: Diagnosis not present

## 2021-04-27 DIAGNOSIS — R911 Solitary pulmonary nodule: Secondary | ICD-10-CM | POA: Diagnosis not present

## 2021-05-05 DIAGNOSIS — C719 Malignant neoplasm of brain, unspecified: Secondary | ICD-10-CM | POA: Diagnosis not present

## 2021-05-18 DIAGNOSIS — C719 Malignant neoplasm of brain, unspecified: Secondary | ICD-10-CM | POA: Diagnosis not present

## 2021-05-18 DIAGNOSIS — R911 Solitary pulmonary nodule: Secondary | ICD-10-CM | POA: Diagnosis not present

## 2021-05-18 DIAGNOSIS — L282 Other prurigo: Secondary | ICD-10-CM | POA: Diagnosis not present

## 2021-06-01 DIAGNOSIS — C719 Malignant neoplasm of brain, unspecified: Secondary | ICD-10-CM | POA: Diagnosis not present

## 2021-06-01 DIAGNOSIS — I10 Essential (primary) hypertension: Secondary | ICD-10-CM | POA: Diagnosis not present

## 2021-06-01 DIAGNOSIS — L282 Other prurigo: Secondary | ICD-10-CM | POA: Diagnosis not present
